# Patient Record
Sex: Female | Born: 1970 | Race: Black or African American | Hispanic: No | Marital: Single | Smoking: Former smoker
Health system: Southern US, Community
[De-identification: ages and names within clinical notes are randomized; demographics above are authoritative.]

## PROBLEM LIST (undated history)

## (undated) DIAGNOSIS — F419 Anxiety disorder, unspecified: Secondary | ICD-10-CM

## (undated) DIAGNOSIS — K219 Gastro-esophageal reflux disease without esophagitis: Secondary | ICD-10-CM

## (undated) DIAGNOSIS — I10 Essential (primary) hypertension: Secondary | ICD-10-CM

## (undated) HISTORY — DX: Gastro-esophageal reflux disease without esophagitis: K21.9

## (undated) HISTORY — DX: Anxiety disorder, unspecified: F41.9

## (undated) HISTORY — DX: Essential (primary) hypertension: I10

---

## 2018-09-30 ENCOUNTER — Emergency Department (HOSPITAL_COMMUNITY)
Admission: EM | Admit: 2018-09-30 | Discharge: 2018-09-30 | Disposition: A | Discharge status: Home / Self Care | Payer: Self-pay | Attending: Emergency Medicine | Admitting: Emergency Medicine

## 2018-09-30 ENCOUNTER — Encounter (HOSPITAL_COMMUNITY): Payer: Self-pay | Admitting: *Deleted

## 2018-09-30 ENCOUNTER — Other Ambulatory Visit: Payer: Self-pay

## 2018-09-30 ENCOUNTER — Emergency Department (HOSPITAL_COMMUNITY): Payer: Self-pay

## 2018-09-30 DIAGNOSIS — R110 Nausea: Secondary | ICD-10-CM | POA: Insufficient documentation

## 2018-09-30 DIAGNOSIS — R519 Headache, unspecified: Secondary | ICD-10-CM

## 2018-09-30 DIAGNOSIS — F17210 Nicotine dependence, cigarettes, uncomplicated: Secondary | ICD-10-CM | POA: Insufficient documentation

## 2018-09-30 DIAGNOSIS — R51 Headache: Secondary | ICD-10-CM | POA: Insufficient documentation

## 2018-09-30 DIAGNOSIS — I10 Essential (primary) hypertension: Secondary | ICD-10-CM | POA: Insufficient documentation

## 2018-09-30 DIAGNOSIS — R1013 Epigastric pain: Secondary | ICD-10-CM | POA: Insufficient documentation

## 2018-09-30 LAB — CBC
HCT: 42.5 % (ref 36.0–46.0)
Hemoglobin: 14.1 g/dL (ref 12.0–15.0)
MCH: 28.7 pg (ref 26.0–34.0)
MCHC: 33.2 g/dL (ref 30.0–36.0)
MCV: 86.6 fL (ref 80.0–100.0)
Platelets: 313 10*3/uL (ref 150–400)
RBC: 4.91 MIL/uL (ref 3.87–5.11)
RDW: 13.5 % (ref 11.5–15.5)
WBC: 6.3 10*3/uL (ref 4.0–10.5)
nRBC: 0 % (ref 0.0–0.2)

## 2018-09-30 LAB — COMPREHENSIVE METABOLIC PANEL
ALT: 17 U/L (ref 0–44)
AST: 19 U/L (ref 15–41)
Albumin: 4.3 g/dL (ref 3.5–5.0)
Alkaline Phosphatase: 64 U/L (ref 38–126)
Anion gap: 12 (ref 5–15)
BUN: 10 mg/dL (ref 6–20)
CO2: 23 mmol/L (ref 22–32)
Calcium: 9.5 mg/dL (ref 8.9–10.3)
Chloride: 103 mmol/L (ref 98–111)
Creatinine, Ser: 0.86 mg/dL (ref 0.44–1.00)
GFR calc Af Amer: >60 mL/min (ref >60)
GFR calc non Af Amer: >60 mL/min (ref >60)
Glucose, Bld: 94 mg/dL (ref 70–99)
Potassium: 4 mmol/L (ref 3.5–5.1)
Sodium: 138 mmol/L (ref 135–145)
Total Bilirubin: 0.4 mg/dL (ref 0.3–1.2)
Total Protein: 8 g/dL (ref 6.5–8.1)

## 2018-09-30 LAB — URINALYSIS, ROUTINE W REFLEX MICROSCOPIC
Bilirubin Urine: NEGATIVE
Glucose, UA: NEGATIVE mg/dL
Ketones, ur: 20 mg/dL — AB
Nitrite: NEGATIVE
Protein, ur: 30 mg/dL — AB
Specific Gravity, Urine: 1.026 (ref 1.005–1.030)
pH: 5 (ref 5.0–8.0)

## 2018-09-30 LAB — I-STAT BETA HCG BLOOD, ED (MC, WL, AP ONLY): I-stat hCG, quantitative: <5 m[IU]/mL (ref <5)

## 2018-09-30 LAB — LIPASE, BLOOD: Lipase: 21 U/L (ref 11–51)

## 2018-09-30 MED ORDER — PROCHLORPERAZINE EDISYLATE 10 MG/2ML IJ SOLN
10.0000 mg | Freq: Once | INTRAMUSCULAR | Status: AC
  Administered 2018-09-30: 20:00:00 10 mg via INTRAVENOUS
  Filled 2018-09-30: qty 2

## 2018-09-30 MED ORDER — DIPHENHYDRAMINE HCL 50 MG/ML IJ SOLN
12.5000 mg | Freq: Once | INTRAMUSCULAR | Status: AC
  Administered 2018-09-30: 20:00:00 12.5 mg via INTRAVENOUS
  Filled 2018-09-30: qty 1

## 2018-09-30 MED ORDER — ONDANSETRON 4 MG PO TBDP: 4.0000 mg | ORAL_TABLET | Freq: Three times a day (TID) | ORAL | 0 refills | Status: DC | PRN

## 2018-09-30 MED ORDER — NAPROXEN 500 MG PO TABS: 500.0000 mg | ORAL_TABLET | Freq: Two times a day (BID) | ORAL | 0 refills | Status: DC

## 2018-09-30 MED ORDER — FAMOTIDINE IN NACL 20-0.9 MG/50ML-% IV SOLN
20.0000 mg | Freq: Once | INTRAVENOUS | Status: AC
  Administered 2018-09-30: 20:00:00 20 mg via INTRAVENOUS
  Filled 2018-09-30: qty 50

## 2018-09-30 MED ORDER — SODIUM CHLORIDE 0.9 % IV BOLUS
1000.0000 mL | Freq: Once | INTRAVENOUS | Status: AC
  Administered 2018-09-30: 20:00:00 1000 mL via INTRAVENOUS

## 2018-09-30 MED ORDER — SODIUM CHLORIDE 0.9% FLUSH: 3.0000 mL | Freq: Once | INTRAVENOUS | Status: DC

## 2018-09-30 NOTE — ED Notes (Signed)
Patient transported to CT 

## 2018-09-30 NOTE — ED Triage Notes (Signed)
Pt in c/o headache x5 days, pt reports dizziness and nausea, pt feels off balance and fatigued, pt reports mid upper abd pain,denies fever, A&O x4

## 2018-09-30 NOTE — ED Notes (Signed)
Pt sitting in triage watching TV on her phone.

## 2018-09-30 NOTE — ED Notes (Signed)
Sent a urine culture with the specimen 

## 2018-09-30 NOTE — ED Provider Notes (Signed)
[facility] EMERGENCY DEPARTMENT Provider Note   CSN: [id number] Arrival date & time: 09/30/18  1346     History   Chief Complaint Chief Complaint  Patient presents with  . Headache    HPI Joyce Peters is a 48 y.o. female with a hx of tobacco abuse, HTN, hyperlipidemia, & pre-diabetes who presents to the ED w/ complaints of headache x 4 days. Patient states headache is located to the temporal region bilaterally, it is aching/throbbing, began gradually & has steadily progressed. Pain is currently a 9/10 in severity, was improved w/ tylenol but this is no longer helping, aggravated w/ eating. States she did have some mild blurry vision but this quickly resolved. She has associated nausea, mild aching/gassy epigastric discomfort, and feels a bit off balance/lightheaded, but no dizziness w/ the room spinning. Also notes intermittent tingling to the fingers/toes to all distal extremities however this has been going on awhile and is not new. She states she has had headaches somewhat similar but never that have lasted this long or been this severe. Denies fever, double vision, numbness, weakness, or syncope. Denies vomiting, chest pain, cough, melena, hematochezia, dysuria, or vaginal bleeding/discharge.      HPI  History reviewed. No pertinent past medical history.  There are no active problems to display for this patient.   History reviewed. No pertinent surgical history.   OB History   No obstetric history on file.      Home Medications    Prior to Admission medications   Not on File    Family History No family history on file.  Social History Social History   Tobacco Use  . Smoking status: Current Every Day Smoker    Packs/day: 0.25    Types: Cigarettes  . Smokeless tobacco: Never Used  Substance Use Topics  . Alcohol use: Not Currently  . Drug use: Not Currently     Allergies   Contrast media [iodinated diagnostic agents]   Review of  Systems Review of Systems  Constitutional: Negative for chills and fever.  Respiratory: Negative for shortness of breath.   Cardiovascular: Negative for chest pain.  Gastrointestinal: Positive for abdominal pain and nausea. Negative for anal bleeding, blood in stool, constipation and vomiting.  Genitourinary: Negative for dysuria, vaginal bleeding and vaginal discharge.  Neurological: Positive for light-headedness and headaches. Negative for dizziness, tremors, seizures, syncope, facial asymmetry, speech difficulty, weakness and numbness.       + for intermittent paresthesias to digits  All other systems reviewed and are negative.    Physical Exam Updated Vital Signs BP (!) 158/83 (BP Location: Left Arm)   Pulse 64   Temp 98.7 F (37.1 C) (Oral)   Resp 16   LMP  (Approximate)   SpO2 95%   Physical Exam Vitals signs and nursing note reviewed.  Constitutional:      General: She is not in acute distress.    Appearance: She is well-developed. She is not toxic-appearing.  HENT:     Head: Normocephalic and atraumatic.     Mouth/Throat:     Pharynx: Oropharynx is clear.  Eyes:     General: Vision grossly intact.        Right eye: No discharge.        Left eye: No discharge.     Extraocular Movements: Extraocular movements intact.     Conjunctiva/sclera: Conjunctivae normal.     Pupils: Pupils are equal, round, and reactive to light.     Comments: No proptosis.  Neck:     Musculoskeletal: Neck supple.     Comments: No nuchal rigidity.  Cardiovascular:     Rate and Rhythm: Normal rate and regular rhythm.  Pulmonary:     Effort: Pulmonary effort is normal. No respiratory distress.     Breath sounds: Normal breath sounds. No wheezing, rhonchi or rales.  Abdominal:     General: There is no distension.     Palpations: Abdomen is soft.     Tenderness: There is no abdominal tenderness. There is no guarding or rebound.  Skin:    General: Skin is warm and dry.     Findings: No  rash.  Neurological:     Mental Status: She is alert.     Comments: Alert. Clear speech. No facial droop. CNIII-XII grossly intact. Bilateral upper and lower extremities' sensation grossly intact. 5/5 symmetric strength with grip strength and with plantar and dorsi flexion bilaterally. Normal finger to nose bilaterally. Negative pronator drift. Negative Romberg sign. Gait is steady and intact.   Psychiatric:        Behavior: Behavior normal.    ED Treatments / Results  Labs (all labs ordered are listed, but only abnormal results are displayed) Labs Reviewed  URINALYSIS, ROUTINE W REFLEX MICROSCOPIC - Abnormal; Notable for the following components:      Result Value   APPearance CLOUDY (*)    Hgb urine dipstick SMALL (*)    Ketones, ur 20 (*)    Protein, ur 30 (*)    Leukocytes,Ua LARGE (*)    Bacteria, UA FEW (*)    All other components within normal limits  URINE CULTURE  LIPASE, BLOOD  COMPREHENSIVE METABOLIC PANEL  CBC  I-STAT BETA HCG BLOOD, ED ([facility], [facility], [facility] ONLY)    EKG None  Radiology Ct Head Wo Contrast  Result Date: 09/30/2018 CLINICAL DATA:  Headache EXAM: CT HEAD WITHOUT CONTRAST TECHNIQUE: Contiguous axial images were obtained from the base of the skull through the vertex without intravenous contrast. COMPARISON:  None. FINDINGS: Brain: No evidence of acute infarction, hemorrhage, hydrocephalus, extra-axial collection or mass lesion/mass effect. Vascular: No hyperdense vessel or unexpected calcification. Skull: Normal. Negative for fracture or focal lesion. Sinuses/Orbits: Mucosal thickening and retention cysts in the ethmoid and maxillary sinuses Other: None IMPRESSION: Negative non contrasted CT appearance of the brain Electronically Signed   By: Jasmine PangKim  Fujinaga M.D.   On: 09/30/2018 21:21    Procedures Procedures (including critical care time)  Medications Ordered in ED Medications  sodium chloride flush (NS) 0.9 % injection 3 mL (3 mLs Intravenous Not Given  09/30/18 1914)     Initial Impression / Assessment and Plan / ED Course  I have reviewed the triage vital signs and the nursing notes.  Pertinent labs & imaging results that were available during my care of the patient were reviewed by me and considered in my medical decision making (see chart for details).   Patient presents to the emergency department with complaints of headache, ultimately some nausea and abdominal pain.  She is nontoxic-appearing, no apparent distress, vitals WNL with exception of her elevated blood pressure, doubt HTN emergency.  She has an overall reassuring physical exam. Labs per triage reviewed: CBC: No leukocytosis or anemia CMP: No electrolyte derangement.  Renal function and LFTs WNL Lipase: WNL Urinalysis: Some changes of infection, however patient is without urinary symptoms Pregnancy test: Negative  Given patient has not had particularly similar headaches given this is more severe and has lasted longer with some  reported balance issues we will proceed with CT head.  Treatment with migraine cocktail.  CT head without acute findings, no mass, bleed, or obvious signs of stroke.  Patient is feeling much better following migraine cocktail, do not suspect SAH, ICH, ischemic CVA, dural venous sinus thrombosis, acute glaucoma, giant cell arteritis, mass, or meningitis.  Regarding her abdominal discomfort, abdomen is nontender without peritoneal signs, reassuring labs, do not suspect acute surgical abdomen.  Will discharge home with symptomatic care including Zofran and naproxen.  Primary care and neurology follow-up provided. I discussed results, treatment plan, need for follow-up, and return precautions with the patient. Provided opportunity for questions, patient confirmed understanding and is in agreement with plan.   Findings and plan of care discussed with supervising physician Dr. Sedonia Small who has evaluated patient & is in agreement.    Final Clinical Impressions(s) /  ED Diagnoses   Final diagnoses:  Acute nonintractable headache, unspecified headache type    ED Discharge Orders         Ordered    naproxen (NAPROSYN) 500 MG tablet  2 times daily     09/30/18 2141    ondansetron (ZOFRAN ODT) 4 MG disintegrating tablet  Every 8 hours PRN     09/30/18 2141           Amaryllis Dyke, PA-C 09/30/18 2143    Maudie Flakes, MD 09/30/18 2349

## 2018-09-30 NOTE — ED Notes (Signed)
Pt in bed; c/o not feeling well, pt is crying and stated she doesn't feel right. Encouraged pt to focus on breathing; vital signs are WNL. Pt stated she is scared, encouraged pt to call family. Pt is directable. Will notify PA.

## 2018-09-30 NOTE — Discharge Instructions (Addendum)
You were seen in the emergency department today for a headache.  Your labs were reassuring.  Your CT scan did not show any acute abnormalities. Your symptoms improved following a migraine cocktail in the ER. We are sending you home with the following medicines: - Naproxen is a nonsteroidal anti-inflammatory medication that will help with pain and swelling. Be sure to take this medication as prescribed with food, 1 pill every 12 hours,  It should be taken with food, as it can cause stomach upset, and more seriously, stomach bleeding. Do not take other nonsteroidal anti-inflammatory medications with this such as Advil, Motrin, Aleve, Mobic, Goodie Powder, or Motrin.    - Zofran-this is a antinausea medication you may take every 8 hours as needed for nausea and vomiting. You make take Tylenol per over the counter dosing with these medications.   We have prescribed you new medication(s) today. Discuss the medications prescribed today with your pharmacist as they can have adverse effects and interactions with your other medicines including over the counter and prescribed medications. Seek medical evaluation if you start to experience new or abnormal symptoms after taking one of these medicines, seek care immediately if you start to experience difficulty breathing, feeling of your throat closing, facial swelling, or rash as these could be indications of a more serious allergic reaction   Please follow-up with your primary care provider and/or with neurology within 3 to 5 days.  Return to the ER for new or worsening symptoms including but not limited to worsening pain, change in your vision, arm or leg becoming weak or numb, dizziness like the room spinning, fever, passing out, or any other concerns.

## 2018-10-02 LAB — URINE CULTURE: Culture: >=100000 — AB

## 2018-10-03 ENCOUNTER — Telehealth: Payer: Self-pay | Admitting: Emergency Medicine

## 2018-10-03 NOTE — Telephone Encounter (Signed)
Post ED Visit - Positive Culture Follow-up  Culture report reviewed by antimicrobial stewardship pharmacist: [place] Team []  Elenor Quinones, Pharm.D. []  Heide Guile, Pharm.D., BCPS AQ-ID []  Parks Neptune, Pharm.D., BCPS [x]  Alycia Rossetti, Pharm.D., BCPS []  [place], Pharm.D., BCPS, AAHIVP []  Legrand Como, Pharm.D., BCPS, AAHIVP []  Salome Arnt, PharmD, BCPS []  Johnnette Gourd, PharmD, BCPS []  Hughes Better, PharmD, BCPS []  Leeroy Cha, PharmD []  Laqueta Linden, PharmD, BCPS []  Albertina Parr, PharmD  [place] Team []  Leodis Sias, PharmD []  Lindell Spar, PharmD []  Royetta Asal, PharmD []  Graylin Shiver, Rph []  Rema Fendt) Glennon Mac, PharmD []  Arlyn Dunning, PharmD []  Netta Cedars, PharmD []  Dia Sitter, PharmD []  Leone Haven, PharmD []  Gretta Arab, PharmD []  Theodis Shove, PharmD []  Peggyann Juba, PharmD []  Reuel Boom, PharmD   Positive urine culture Asymptomatic, no further patient follow-up is required at this time.  Sandi Raveling Wisam Siefring 10/03/2018, 11:41 AM

## 2018-12-27 ENCOUNTER — Encounter (HOSPITAL_COMMUNITY): Payer: Self-pay | Admitting: Emergency Medicine

## 2018-12-27 ENCOUNTER — Other Ambulatory Visit: Payer: Self-pay

## 2018-12-27 ENCOUNTER — Emergency Department (HOSPITAL_COMMUNITY)
Admission: EM | Admit: 2018-12-27 | Discharge: 2018-12-27 | Disposition: A | Discharge status: Home / Self Care | Payer: Self-pay | Attending: Emergency Medicine | Admitting: Emergency Medicine

## 2018-12-27 DIAGNOSIS — A599 Trichomoniasis, unspecified: Secondary | ICD-10-CM

## 2018-12-27 DIAGNOSIS — F17210 Nicotine dependence, cigarettes, uncomplicated: Secondary | ICD-10-CM | POA: Insufficient documentation

## 2018-12-27 DIAGNOSIS — Z79899 Other long term (current) drug therapy: Secondary | ICD-10-CM | POA: Insufficient documentation

## 2018-12-27 DIAGNOSIS — N760 Acute vaginitis: Secondary | ICD-10-CM | POA: Insufficient documentation

## 2018-12-27 DIAGNOSIS — B9689 Other specified bacterial agents as the cause of diseases classified elsewhere: Secondary | ICD-10-CM

## 2018-12-27 LAB — WET PREP, GENITAL
Sperm: NONE SEEN
Yeast Wet Prep HPF POC: NONE SEEN

## 2018-12-27 LAB — URINALYSIS, ROUTINE W REFLEX MICROSCOPIC
Bacteria, UA: NONE SEEN
Bilirubin Urine: NEGATIVE
Glucose, UA: NEGATIVE mg/dL
Ketones, ur: NEGATIVE mg/dL
Nitrite: NEGATIVE
Protein, ur: NEGATIVE mg/dL
Specific Gravity, Urine: 1.03 (ref 1.005–1.030)
pH: 5 (ref 5.0–8.0)

## 2018-12-27 LAB — PREGNANCY, URINE: Preg Test, Ur: NEGATIVE

## 2018-12-27 MED ORDER — AZITHROMYCIN 250 MG PO TABS
1000.0000 mg | ORAL_TABLET | Freq: Once | ORAL | Status: AC
  Administered 2018-12-27: 1000 mg via ORAL
  Filled 2018-12-27: qty 4

## 2018-12-27 MED ORDER — METRONIDAZOLE 500 MG PO TABS: 500.0000 mg | ORAL_TABLET | Freq: Two times a day (BID) | ORAL | 0 refills | Status: DC

## 2018-12-27 MED ORDER — LIDOCAINE HCL (PF) 1 % IJ SOLN
INTRAMUSCULAR | Status: AC
  Administered 2018-12-27: 15:00:00 5 mL
  Filled 2018-12-27: qty 5

## 2018-12-27 MED ORDER — STERILE WATER FOR INJECTION IJ SOLN
INTRAMUSCULAR | Status: AC
  Administered 2018-12-27: 15:00:00 10 mL
  Filled 2018-12-27: qty 10

## 2018-12-27 MED ORDER — CEFTRIAXONE SODIUM 250 MG IJ SOLR
250.0000 mg | Freq: Once | INTRAMUSCULAR | Status: AC
  Administered 2018-12-27: 14:00:00 250 mg via INTRAMUSCULAR
  Filled 2018-12-27: qty 250

## 2018-12-27 NOTE — ED Triage Notes (Signed)
Pt in with c/o vaginal discharge x 1 wk. States it is clear and malodorous. Denies any pain, bleeding or urinary symptoms.

## 2018-12-27 NOTE — ED Notes (Signed)
Discharge instructions and prescription discussed with Pt. Pt verbalized understanding. Pt stable and ambulatory.    

## 2018-12-27 NOTE — Discharge Instructions (Signed)
You have been treated today for an STD.   You need to continue taking Flagyl for treatment of trichomonas and BV. Take Flagyl as directed.  It is very important that you do not consume any alcohol while taking this medication as it will cause you to become violently ill.  The gonorrhea and chlamydia test results with take 2-3 days to return. If there is an abnormal result, you will be notified. If you do not hear anything, that means the results were negative. You can also log on MyChart to see the results.  If you are positive, you have been treated and did not need any further evaluation.  Your sexual partner needs to be treated too. Do not have sexual intercourse for the next 7 days and after your partner has been treated.   Follow-up with your primary care doctor in 2-4 days. If you do not have a primary care doctor, you can use one listed in the paperwork.   Return to the Emergency Department for any fever, abdominal pain, difficulty breathing, nausea/vomiting or any other worsening or concerning symptoms.

## 2018-12-27 NOTE — ED Provider Notes (Signed)
[facility] EMERGENCY DEPARTMENT Provider Note   CSN: [id number] Arrival date & time: 12/27/18  1227     History Chief Complaint  Patient presents with  . Vaginal Discharge    Joyce Peters is a 48 y.o. female who presents for evaluation of 1 week of vaginal discharge.  She has had foul-smelling, white discharge noted.  She states that she has not had any vaginal pain, vaginal bleeding, dysuria, hematuria.  She states she is not currently sexually active and that the last time she had intercourse was about a year ago.  She denies any fevers, abdominal pain, nausea/vomiting.  The history is provided by the patient.       History reviewed. No pertinent past medical history.  There are no problems to display for this patient.   History reviewed. No pertinent surgical history.   OB History   No obstetric history on file.     No family history on file.  Social History   Tobacco Use  . Smoking status: Current Every Day Smoker    Packs/day: 0.25    Types: Cigarettes  . Smokeless tobacco: Never Used  Substance Use Topics  . Alcohol use: Not Currently  . Drug use: Not Currently    Home Medications Prior to Admission medications   Medication Sig Start Date End Date Taking? Authorizing Provider  metroNIDAZOLE (FLAGYL) 500 MG tablet Take 1 tablet (500 mg total) by mouth 2 (two) times daily. 12/27/18   Volanda Napoleon, PA-C  naproxen (NAPROSYN) 500 MG tablet Take 1 tablet (500 mg total) by mouth 2 (two) times daily. 09/30/18   Petrucelli, Samantha R, PA-C  ondansetron (ZOFRAN ODT) 4 MG disintegrating tablet Take 1 tablet (4 mg total) by mouth every 8 (eight) hours as needed for nausea or vomiting. 09/30/18   Petrucelli, Samantha R, PA-C    Allergies    Contrast media [iodinated diagnostic agents]  Review of Systems   Review of Systems  Genitourinary: Positive for vaginal discharge. Negative for dysuria, hematuria, vaginal bleeding and vaginal pain.   All other systems reviewed and are negative.   Physical Exam Updated Vital Signs BP 128/76 (BP Location: Right Arm)   Pulse 97   Temp 98.6 F (37 C) (Oral)   Resp 20   Wt 81.6 kg   SpO2 99%   Physical Exam Vitals and nursing note reviewed. Exam conducted with a chaperone present.  Constitutional:      Appearance: She is well-developed.  HENT:     Head: Normocephalic and atraumatic.  Eyes:     General: No scleral icterus.       Right eye: No discharge.        Left eye: No discharge.     Conjunctiva/sclera: Conjunctivae normal.  Pulmonary:     Effort: Pulmonary effort is normal.  Abdominal:     Comments: Abdomen is soft, non-distended, non-tender. No rigidity, No guarding. No peritoneal signs.  Genitourinary:    Vagina: Vaginal discharge present.     Cervix: No cervical motion tenderness.     Adnexa:        Right: No mass or tenderness.         Left: No mass or tenderness.       Comments: The exam was performed with a chaperone present. Normal external female genitalia. No lesions, rash, or sores.  Cervix is slightly erythematous.  White discharge in vaginal vault.  No CMT.  No adnexal mass or tenderness noted bilaterally. Skin:  General: Skin is warm and dry.  Neurological:     Mental Status: She is alert.  Psychiatric:        Speech: Speech normal.        Behavior: Behavior normal.     ED Results / Procedures / Treatments   Labs (all labs ordered are listed, but only abnormal results are displayed) Labs Reviewed  WET PREP, GENITAL - Abnormal; Notable for the following components:      Result Value   Trich, Wet Prep PRESENT (*)    Clue Cells Wet Prep HPF POC PRESENT (*)    WBC, Wet Prep HPF POC MANY (*)    All other components within normal limits  URINALYSIS, ROUTINE W REFLEX MICROSCOPIC - Abnormal; Notable for the following components:   Hgb urine dipstick SMALL (*)    Leukocytes,Ua TRACE (*)    All other components within normal limits  PREGNANCY,  URINE  GC/CHLAMYDIA PROBE AMP ([place]) NOT AT [facility]    EKG None  Radiology No results found.  Procedures Procedures (including critical care time)  Medications Ordered in ED Medications  azithromycin (ZITHROMAX) tablet 1,000 mg (has no administration in time range)  cefTRIAXone (ROCEPHIN) injection 250 mg (has no administration in time range)    ED Course  I have reviewed the triage vital signs and the nursing notes.  Pertinent labs & imaging results that were available during my care of the patient were reviewed by me and considered in my medical decision making (see chart for details).    MDM Rules/Calculators/A&P       48 year old female who presents for evaluation of vaginal discharge x1 week.  Reports it is malodorous.  No vaginal bleeding, dysuria, hematuria, fevers, abdominal pain, nausea/vomiting.  Reports she last was sexually active about a year ago. Patient is afebrile, non-toxic appearing, sitting comfortably on examination table. Vital signs reviewed and stable.  Benign abdominal exam.  Plan for pelvic, urine.  Pelvic exam as documented above.  No CMT that would be concerning for PID.  No adnexal mass or tenderness noted bilaterally.  No indication for further ultrasound imaging.  Plan pregnancy negative.  UA shows trace leukocytes.  She is not having any symptoms.  Wet prep shows positive cells, clue cells.  We will plan to treat.  Discussed results with patient.  She is agreeable to plan.  She is requesting treatment for gonorrhea and chlamydia while here in the ED.  Patient with no known drug allergies. At this time, patient exhibits no emergent life-threatening condition that require further evaluation in ED. Patient had ample opportunity for questions and discussion. All patient's questions were answered with full understanding. Strict return precautions discussed. Patient expresses understanding and agreement to plan.   Portions of this note were generated  with Scientist, clinical (histocompatibility and immunogenetics). Dictation errors may occur despite best attempts at proofreading.  Final Clinical Impression(s) / ED Diagnoses Final diagnoses:  BV (bacterial vaginosis)  Trichimoniasis    Rx / DC Orders ED Discharge Orders         Ordered    metroNIDAZOLE (FLAGYL) 500 MG tablet  2 times daily     12/27/18 1401           Rosana Hoes 12/27/18 1422    Jacalyn Lefevre, MD 12/27/18 1448

## 2018-12-30 LAB — GC/CHLAMYDIA PROBE AMP (~~LOC~~) NOT AT ARMC
Chlamydia: NEGATIVE
Neisseria Gonorrhea: NEGATIVE

## 2019-10-19 ENCOUNTER — Other Ambulatory Visit: Payer: Self-pay

## 2019-10-19 ENCOUNTER — Emergency Department (HOSPITAL_COMMUNITY): Payer: Self-pay

## 2019-10-19 ENCOUNTER — Encounter (HOSPITAL_COMMUNITY): Payer: Self-pay

## 2019-10-19 ENCOUNTER — Observation Stay (HOSPITAL_COMMUNITY)
Admission: EM | Admit: 2019-10-19 | Discharge: 2019-10-21 | Disposition: A | Discharge status: Home / Self Care | Payer: Self-pay | Attending: Internal Medicine | Admitting: Internal Medicine

## 2019-10-19 DIAGNOSIS — Z20822 Contact with and (suspected) exposure to covid-19: Secondary | ICD-10-CM | POA: Insufficient documentation

## 2019-10-19 DIAGNOSIS — F17210 Nicotine dependence, cigarettes, uncomplicated: Secondary | ICD-10-CM | POA: Insufficient documentation

## 2019-10-19 DIAGNOSIS — Z79899 Other long term (current) drug therapy: Secondary | ICD-10-CM | POA: Insufficient documentation

## 2019-10-19 DIAGNOSIS — R079 Chest pain, unspecified: Secondary | ICD-10-CM | POA: Diagnosis present

## 2019-10-19 DIAGNOSIS — I471 Supraventricular tachycardia, unspecified: Secondary | ICD-10-CM | POA: Diagnosis present

## 2019-10-19 DIAGNOSIS — I2489 Other forms of acute ischemic heart disease: Secondary | ICD-10-CM | POA: Diagnosis present

## 2019-10-19 DIAGNOSIS — R778 Other specified abnormalities of plasma proteins: Secondary | ICD-10-CM | POA: Insufficient documentation

## 2019-10-19 LAB — CBC
HCT: 36.4 % (ref 36.0–46.0)
Hemoglobin: 11.5 g/dL — ABNORMAL LOW (ref 12.0–15.0)
MCH: 27.4 pg (ref 26.0–34.0)
MCHC: 31.6 g/dL (ref 30.0–36.0)
MCV: 86.7 fL (ref 80.0–100.0)
Platelets: 298 10*3/uL (ref 150–400)
RBC: 4.2 MIL/uL (ref 3.87–5.11)
RDW: 13.5 % (ref 11.5–15.5)
WBC: 6.6 10*3/uL (ref 4.0–10.5)
nRBC: 0 % (ref 0.0–0.2)

## 2019-10-19 LAB — COMPREHENSIVE METABOLIC PANEL
ALT: 19 U/L (ref 0–44)
AST: 19 U/L (ref 15–41)
Albumin: 3.4 g/dL — ABNORMAL LOW (ref 3.5–5.0)
Alkaline Phosphatase: 50 U/L (ref 38–126)
Anion gap: 10 (ref 5–15)
BUN: 8 mg/dL (ref 6–20)
CO2: 23 mmol/L (ref 22–32)
Calcium: 8.1 mg/dL — ABNORMAL LOW (ref 8.9–10.3)
Chloride: 109 mmol/L (ref 98–111)
Creatinine, Ser: 0.84 mg/dL (ref 0.44–1.00)
GFR calc Af Amer: >60 mL/min (ref >60)
GFR calc non Af Amer: >60 mL/min (ref >60)
Glucose, Bld: 99 mg/dL (ref 70–99)
Potassium: 2.8 mmol/L — ABNORMAL LOW (ref 3.5–5.1)
Sodium: 142 mmol/L (ref 135–145)
Total Bilirubin: 0.5 mg/dL (ref 0.3–1.2)
Total Protein: 5.9 g/dL — ABNORMAL LOW (ref 6.5–8.1)

## 2019-10-19 LAB — I-STAT BETA HCG BLOOD, ED (MC, WL, AP ONLY): I-stat hCG, quantitative: <5 m[IU]/mL (ref <5)

## 2019-10-19 LAB — MAGNESIUM: Magnesium: 1.5 mg/dL — ABNORMAL LOW (ref 1.7–2.4)

## 2019-10-19 LAB — TROPONIN I (HIGH SENSITIVITY): Troponin I (High Sensitivity): 71 ng/L — ABNORMAL HIGH (ref <18)

## 2019-10-19 MED ORDER — ONDANSETRON HCL 4 MG/2ML IJ SOLN
4.0000 mg | Freq: Once | INTRAMUSCULAR | Status: AC
  Administered 2019-10-19: 4 mg via INTRAVENOUS
  Filled 2019-10-19: qty 2

## 2019-10-19 MED ORDER — POTASSIUM CHLORIDE 10 MEQ/100ML IV SOLN
10.0000 meq | INTRAVENOUS | Status: AC
  Administered 2019-10-19 (×2): 10 meq via INTRAVENOUS
  Filled 2019-10-19 (×2): qty 100

## 2019-10-19 MED ORDER — POTASSIUM CHLORIDE CRYS ER 20 MEQ PO TBCR
40.0000 meq | EXTENDED_RELEASE_TABLET | Freq: Once | ORAL | Status: AC
  Administered 2019-10-19: 40 meq via ORAL
  Filled 2019-10-19: qty 2

## 2019-10-19 MED ORDER — MAGNESIUM OXIDE 400 (241.3 MG) MG PO TABS
800.0000 mg | ORAL_TABLET | Freq: Once | ORAL | Status: AC
  Administered 2019-10-19: 800 mg via ORAL
  Filled 2019-10-19: qty 2

## 2019-10-19 NOTE — ED Provider Notes (Signed)
[facility]-EMERGENCY DEPT [facility] Emergency Department Provider Note MRN:  [id number]  Arrival date & time: 10/19/19     Chief Complaint   Palpitations   History of Present Illness   Joyce Peters is a 49 y.o. year-old female with no pertinent past medical history presenting to the ED with chief complaint of ideation.  Sudden onset palpitations, shortness of breath, chest tightness.  This occurred while patient was walking out of the police department.  She had a stressful day and the police knocked on her door and she had to go and provide bail for a family member.  She was found to be in SVT by EMS and provided with adenosine which improved her heart rate and symptoms.  She continues to feel weak, "wore out", still endorsing some mild chest pain as well as a dull frontal headache.  Denies any vomiting, no abdominal pain, no recent leg pain or swelling, no birth control pills, no recent travel.  Review of Systems  A complete 10 system review of systems was obtained and all systems are negative except as noted in the HPI and PMH.   Patient's Health History   History reviewed. No pertinent past medical history.  History reviewed. No pertinent surgical history.  No family history on file.  Social History   Socioeconomic History   Marital status: Single    Spouse name: Not on file   Number of children: Not on file   Years of education: Not on file   Highest education level: Not on file  Occupational History   Not on file  Tobacco Use   Smoking status: Current Every Day Smoker    Packs/day: 0.25    Types: Cigarettes   Smokeless tobacco: Never Used  Vaping Use   Vaping Use: Never used  Substance and Sexual Activity   Alcohol use: Not Currently   Drug use: Not Currently   Sexual activity: Not on file  Other Topics Concern   Not on file  Social History Narrative   Not on file   Social Determinants of Health   Financial Resource Strain:    Difficulty of  Paying Living Expenses: Not on file  Food Insecurity:    Worried About Running Out of Food in the Last Year: Not on file   Ran Out of Food in the Last Year: Not on file  Transportation Needs:    Lack of Transportation (Medical): Not on file   Lack of Transportation (Non-Medical): Not on file  Physical Activity:    Days of Exercise per Week: Not on file   Minutes of Exercise per Session: Not on file  Stress:    Feeling of Stress : Not on file  Social Connections:    Frequency of Communication with Friends and Family: Not on file   Frequency of Social Gatherings with Friends and Family: Not on file   Attends Religious Services: Not on file   Active Member of Clubs or Organizations: Not on file   Attends Banker Meetings: Not on file   Marital Status: Not on file  Intimate Partner Violence:    Fear of Current or Ex-Partner: Not on file   Emotionally Abused: Not on file   Physically Abused: Not on file   Sexually Abused: Not on file     Physical Exam   Vitals:   10/19/19 2230 10/19/19 2300  BP: (!) 148/87 (!) 156/98  Pulse: 81 89  Resp: 19 (!) 23  Temp:    SpO2: 99%  97%    CONSTITUTIONAL: Well-appearing, NAD NEURO:  Alert and oriented x 3, no focal deficits EYES:  eyes equal and reactive ENT/NECK:  no LAD, no JVD CARDIO: Regular rate, well-perfused, normal S1 and S2 PULM:  CTAB no wheezing or rhonchi GI/GU:  normal bowel sounds, non-distended, non-tender MSK/SPINE:  No gross deformities, no edema SKIN:  no rash, atraumatic PSYCH:  Appropriate speech and behavior  *Additional and/or pertinent findings included in MDM below  Diagnostic and Interventional Summary    EKG Interpretation  Date/Time:  Monday October 19 2019 20:23:21 EDT Ventricular Rate:  88 PR Interval:    QRS Duration: 85 QT Interval:  370 QTC Calculation: 448 R Axis:   44 Text Interpretation: Sinus rhythm Left atrial enlargement Minimal ST depression, inferior leads No  previous ECGs available Confirmed by Kennis Carina [phone]) on 10/19/2019 9:13:32 PM      Labs Reviewed  CBC - Abnormal; Notable for the following components:      Result Value   Hemoglobin 11.5 (*)    All other components within normal limits  COMPREHENSIVE METABOLIC PANEL - Abnormal; Notable for the following components:   Potassium 2.8 (*)    Calcium 8.1 (*)    Total Protein 5.9 (*)    Albumin 3.4 (*)    All other components within normal limits  MAGNESIUM - Abnormal; Notable for the following components:   Magnesium 1.5 (*)    All other components within normal limits  TROPONIN I (HIGH SENSITIVITY) - Abnormal; Notable for the following components:   Troponin I (High Sensitivity) 71 (*)    All other components within normal limits  D-DIMER, QUANTITATIVE (NOT AT [facility])  I-STAT BETA HCG BLOOD, ED ([facility], [facility], [facility] ONLY)  TROPONIN I (HIGH SENSITIVITY)    DG Chest Port 1 View  Final Result      Medications  potassium chloride 10 mEq in 100 mL IVPB (has no administration in time range)  magnesium oxide (MAG-OX) tablet 800 mg (has no administration in time range)  potassium chloride SA (KLOR-CON) CR tablet 40 mEq (has no administration in time range)     Procedures  /  Critical Care Procedures  ED Course and Medical Decision Making  I have reviewed the triage vital signs, the nursing notes, and pertinent available records from the EMR.  Listed above are laboratory and imaging tests that I personally ordered, reviewed, and interpreted and then considered in my medical decision making (see below for details).  SVT seen by EMS, responded to adenosine.  Patient is currently in sinus rhythm, no acute distress, continues to have some mild chest tightness.  No significant cardiovascular risk factors, no significant PE risk factors.  Given the continued chest discomfort will evaluate with 2 troponins.  EKG demonstrating some nonspecific findings with no prior for comparison.     First  troponin is 77, unclear if related to rate, ischemia, and this does increase the concern for PE.  Adding on D-dimer.  Plan for admission hospitalist vs cards pending second trop.  Signed out to oncoming provider at shift change.  Elmer Sow. Pilar Plate, MD [facility] Emergency Medicine [facility] [email or web address]  Final Clinical Impressions(s) / ED Diagnoses     ICD-10-CM   1. SVT (supraventricular tachycardia) (HCC)  I47.1   2. Chest pain, unspecified type  R07.9   3. Elevated troponin  R77.8     ED Discharge Orders    None       Discharge Instructions Discussed with  and Provided to Patient:   Discharge Instructions   None       Sabas Sous, MD 10/19/19 2316

## 2019-10-19 NOTE — ED Triage Notes (Signed)
Pt from home; reports palpitations and SOB after receiving stressful news; EMS found pt to be in SVT at 190; IV established and 6 mg Adenosine given with return to NSR of 85 after administration.  Pt currently denies complaints.

## 2019-10-20 ENCOUNTER — Encounter (HOSPITAL_COMMUNITY): Payer: Self-pay | Admitting: Internal Medicine

## 2019-10-20 ENCOUNTER — Observation Stay (HOSPITAL_BASED_OUTPATIENT_CLINIC_OR_DEPARTMENT_OTHER): Payer: Self-pay

## 2019-10-20 DIAGNOSIS — I2489 Other forms of acute ischemic heart disease: Secondary | ICD-10-CM | POA: Diagnosis present

## 2019-10-20 DIAGNOSIS — R9431 Abnormal electrocardiogram [ECG] [EKG]: Secondary | ICD-10-CM

## 2019-10-20 DIAGNOSIS — I471 Supraventricular tachycardia, unspecified: Secondary | ICD-10-CM

## 2019-10-20 DIAGNOSIS — R079 Chest pain, unspecified: Secondary | ICD-10-CM

## 2019-10-20 DIAGNOSIS — R778 Other specified abnormalities of plasma proteins: Secondary | ICD-10-CM

## 2019-10-20 DIAGNOSIS — R7989 Other specified abnormal findings of blood chemistry: Secondary | ICD-10-CM

## 2019-10-20 DIAGNOSIS — E7849 Other hyperlipidemia: Secondary | ICD-10-CM

## 2019-10-20 HISTORY — DX: Supraventricular tachycardia: I47.1

## 2019-10-20 HISTORY — DX: Supraventricular tachycardia, unspecified: I47.10

## 2019-10-20 LAB — ECHOCARDIOGRAM COMPLETE
Area-P 1/2: 2.91 cm2
Calc EF: 64.5 %
S' Lateral: 2.9 cm
Single Plane A2C EF: 63.5 %
Single Plane A4C EF: 67.1 %

## 2019-10-20 LAB — RAPID URINE DRUG SCREEN, HOSP PERFORMED
Amphetamines: NOT DETECTED
Barbiturates: NOT DETECTED
Benzodiazepines: NOT DETECTED
Cocaine: NOT DETECTED
Opiates: NOT DETECTED
Tetrahydrocannabinol: POSITIVE — AB

## 2019-10-20 LAB — RESPIRATORY PANEL BY RT PCR (FLU A&B, COVID)
Influenza A by PCR: NEGATIVE
Influenza B by PCR: NEGATIVE
SARS Coronavirus 2 by RT PCR: NEGATIVE

## 2019-10-20 LAB — HIV ANTIBODY (ROUTINE TESTING W REFLEX): HIV Screen 4th Generation wRfx: NONREACTIVE

## 2019-10-20 LAB — CBC
HCT: 38.1 % (ref 36.0–46.0)
Hemoglobin: 12.2 g/dL (ref 12.0–15.0)
MCH: 28 pg (ref 26.0–34.0)
MCHC: 32 g/dL (ref 30.0–36.0)
MCV: 87.4 fL (ref 80.0–100.0)
Platelets: 315 10*3/uL (ref 150–400)
RBC: 4.36 MIL/uL (ref 3.87–5.11)
RDW: 13.7 % (ref 11.5–15.5)
WBC: 6.7 10*3/uL (ref 4.0–10.5)
nRBC: 0 % (ref 0.0–0.2)

## 2019-10-20 LAB — BASIC METABOLIC PANEL
Anion gap: 11 (ref 5–15)
BUN: 8 mg/dL (ref 6–20)
CO2: 23 mmol/L (ref 22–32)
Calcium: 9.1 mg/dL (ref 8.9–10.3)
Chloride: 106 mmol/L (ref 98–111)
Creatinine, Ser: 0.7 mg/dL (ref 0.44–1.00)
GFR calc Af Amer: >60 mL/min (ref >60)
GFR calc non Af Amer: >60 mL/min (ref >60)
Glucose, Bld: 97 mg/dL (ref 70–99)
Potassium: 4.1 mmol/L (ref 3.5–5.1)
Sodium: 140 mmol/L (ref 135–145)

## 2019-10-20 LAB — LIPID PANEL
Cholesterol: 237 mg/dL — ABNORMAL HIGH (ref 0–200)
HDL: 44 mg/dL (ref >40)
LDL Cholesterol: 178 mg/dL — ABNORMAL HIGH (ref 0–99)
Total CHOL/HDL Ratio: 5.4 RATIO
Triglycerides: 73 mg/dL (ref <150)
VLDL: 15 mg/dL (ref 0–40)

## 2019-10-20 LAB — D-DIMER, QUANTITATIVE: D-Dimer, Quant: 0.45 ug/mL-FEU (ref 0.00–0.50)

## 2019-10-20 LAB — PREGNANCY, URINE: Preg Test, Ur: NEGATIVE

## 2019-10-20 LAB — TROPONIN I (HIGH SENSITIVITY)
Troponin I (High Sensitivity): 312 ng/L (ref <18)
Troponin I (High Sensitivity): 170 ng/L (ref <18)

## 2019-10-20 LAB — MAGNESIUM: Magnesium: 1.9 mg/dL (ref 1.7–2.4)

## 2019-10-20 LAB — T4, FREE: Free T4: 0.86 ng/dL (ref 0.61–1.12)

## 2019-10-20 LAB — HEPARIN LEVEL (UNFRACTIONATED): Heparin Unfractionated: 0.23 IU/mL — ABNORMAL LOW (ref 0.30–0.70)

## 2019-10-20 LAB — TSH: TSH: 0.884 u[IU]/mL (ref 0.350–4.500)

## 2019-10-20 MED ORDER — ACETAMINOPHEN 325 MG PO TABS
650.0000 mg | ORAL_TABLET | Freq: Four times a day (QID) | ORAL | Status: DC | PRN
  Administered 2019-10-21 (×2): 650 mg via ORAL
  Filled 2019-10-20 (×2): qty 2

## 2019-10-20 MED ORDER — ASPIRIN EC 81 MG PO TBEC
81.0000 mg | DELAYED_RELEASE_TABLET | Freq: Every day | ORAL | Status: DC
  Administered 2019-10-21: 81 mg via ORAL
  Filled 2019-10-20: qty 1

## 2019-10-20 MED ORDER — DIPHENHYDRAMINE HCL 25 MG PO CAPS: 50.0000 mg | ORAL_CAPSULE | Freq: Once | ORAL | Status: DC

## 2019-10-20 MED ORDER — ONDANSETRON HCL 4 MG PO TABS: 4.0000 mg | ORAL_TABLET | Freq: Four times a day (QID) | ORAL | Status: DC | PRN

## 2019-10-20 MED ORDER — HEPARIN BOLUS VIA INFUSION
4000.0000 [IU] | Freq: Once | INTRAVENOUS | Status: AC
  Administered 2019-10-20: 4000 [IU] via INTRAVENOUS
  Filled 2019-10-20: qty 4000

## 2019-10-20 MED ORDER — ENOXAPARIN SODIUM 40 MG/0.4ML ~~LOC~~ SOLN: 40.0000 mg | SUBCUTANEOUS | Status: DC

## 2019-10-20 MED ORDER — PREDNISONE 20 MG PO TABS
50.0000 mg | ORAL_TABLET | Freq: Four times a day (QID) | ORAL | Status: AC
  Administered 2019-10-20 – 2019-10-21 (×3): 50 mg via ORAL
  Filled 2019-10-20 (×4): qty 2

## 2019-10-20 MED ORDER — METHYLPREDNISOLONE SODIUM SUCC 40 MG IJ SOLR: 40.0000 mg | INTRAMUSCULAR | Status: DC

## 2019-10-20 MED ORDER — ASPIRIN 81 MG PO CHEW
324.0000 mg | CHEWABLE_TABLET | Freq: Once | ORAL | Status: AC
  Administered 2019-10-20: 324 mg via ORAL
  Filled 2019-10-20: qty 4

## 2019-10-20 MED ORDER — NITROGLYCERIN 0.4 MG SL SUBL: 0.4000 mg | SUBLINGUAL_TABLET | SUBLINGUAL | Status: DC | PRN

## 2019-10-20 MED ORDER — DIPHENHYDRAMINE HCL 50 MG/ML IJ SOLN: 50.0000 mg | Freq: Once | INTRAMUSCULAR | Status: DC

## 2019-10-20 MED ORDER — ONDANSETRON HCL 4 MG/2ML IJ SOLN: 4.0000 mg | Freq: Four times a day (QID) | INTRAMUSCULAR | Status: DC | PRN

## 2019-10-20 MED ORDER — DIPHENHYDRAMINE HCL 25 MG PO CAPS
50.0000 mg | ORAL_CAPSULE | Freq: Once | ORAL | Status: AC
  Administered 2019-10-21: 50 mg via ORAL
  Filled 2019-10-20: qty 2

## 2019-10-20 MED ORDER — PREDNISONE 20 MG PO TABS
50.0000 mg | ORAL_TABLET | Freq: Four times a day (QID) | ORAL | Status: DC
  Administered 2019-10-20: 50 mg via ORAL
  Filled 2019-10-20: qty 3

## 2019-10-20 MED ORDER — ACETAMINOPHEN 650 MG RE SUPP: 650.0000 mg | Freq: Four times a day (QID) | RECTAL | Status: DC | PRN

## 2019-10-20 MED ORDER — HEPARIN (PORCINE) 25000 UT/250ML-% IV SOLN
1150.0000 [IU]/h | INTRAVENOUS | Status: DC
  Administered 2019-10-20: 1000 [IU]/h via INTRAVENOUS
  Administered 2019-10-21: 1150 [IU]/h via INTRAVENOUS
  Filled 2019-10-20 (×2): qty 250

## 2019-10-20 MED ORDER — HEPARIN BOLUS VIA INFUSION
1000.0000 [IU] | Freq: Once | INTRAVENOUS | Status: AC
  Administered 2019-10-20: 1000 [IU] via INTRAVENOUS
  Filled 2019-10-20: qty 1000

## 2019-10-20 MED ORDER — ATORVASTATIN CALCIUM 10 MG PO TABS
20.0000 mg | ORAL_TABLET | Freq: Every day | ORAL | Status: DC
  Administered 2019-10-20 – 2019-10-21 (×2): 20 mg via ORAL
  Filled 2019-10-20 (×2): qty 2

## 2019-10-20 NOTE — Progress Notes (Signed)
ANTICOAGULATION CONSULT NOTE  Pharmacy Consult for heparin Indication: chest pain/ACS  Allergies  Allergen Reactions  . Contrast Media [Iodinated Diagnostic Agents]     Patient Measurements:  Weight ~ 180lb  Vital Signs: Temp: 99 F (37.2 C) (10/05 1709) Temp Source: Oral (10/05 1709) BP: 151/65 (10/05 1709) Pulse Rate: 76 (10/05 1709)  Labs: Recent Labs    10/19/19 2126 10/19/19 2259 10/20/19 0512 10/20/19 1639  HGB 11.5*  --  12.2  --   HCT 36.4  --  38.1  --   PLT 298  --  315  --   HEPARINUNFRC  --   --   --  0.23*  CREATININE 0.84  --  0.70  --   TROPONINIHS 71* 312* 170*  --     CrCl cannot be calculated (Unknown ideal weight.).   Medical History: Past Medical History:  Diagnosis Date  . SVT (supraventricular tachycardia) (HCC) 10/20/2019    Assessment: 49 yo lady with recent SVT and elevated troponins to start heparin.  She was not on anticoagulation PTA.    Initial heparin level below goal this evening (late lab draw) at 0.23 on 1000 units/hr. No bleeding issues noted, planning CTA in am.   Goal of Therapy:  Heparin level 0.3-0.7 units/ml Monitor platelets by anticoagulation protocol: Yes   Plan:  Heparin 1000 unit bolus and increase drip to 1150 units/hr Check heparin level in am Daily HL and CBC while on heparin Monitor for bleeding complications  Sheppard Coil PharmD., BCPS Clinical Pharmacist 10/20/2019 5:16 PM

## 2019-10-20 NOTE — H&P (Addendum)
History and Physical    Joyce Peters GLO:[id number] DOB: May 08, 1970 DOA: 10/19/2019  PCP: Patient, No Pcp Per  Patient coming from: Home.  Chief Complaint: Chest pain and palpitations.  HPI: Joyce Peters is a 49 y.o. female with history of tobacco abuse started experiencing chest pressure with palpitation when patient came back home last evening.  It increased with exertion no associated shortness of breath or diaphoresis.  Since it persisted EMS was called and patient was found to be in SVT.  On the route to the ER patient was given 1 dose of adenosine 6 mg IV following which patient converted to sinus rhythm.  ED Course: While in the ER patient had persistent chest pressure nonradiating with no shortness of breath.  Chest x-ray was unremarkable EKG shows normal sinus rhythm with minimal ST depression in the inferior leads.  Initial and I sensitive troponin was 71 the second 1 was 312.  D-dimer was normal and cardiology was consulted.  At this time cardiology recommended keeping patient on heparin infusion n.p.o. and aspirin and likely may need further cardiac procedure.  Covid test was negative.  Chest x-ray was unremarkable.  Review of Systems: As per HPI, rest all negative.   History reviewed. No pertinent past medical history.  Past Surgical History:  Procedure Laterality Date  . CESAREAN SECTION       reports that she has been smoking cigarettes. She has been smoking about 0.25 packs per day. She has never used smokeless tobacco. She reports previous alcohol use. She reports previous drug use.  Allergies  Allergen Reactions  . Contrast Media [Iodinated Diagnostic Agents]     Family History  Problem Relation Age of Onset  . CAD Mother     Prior to Admission medications   Medication Sig Start Date End Date Taking? Authorizing Provider  acetaminophen (TYLENOL) 500 MG tablet Take 500 mg by mouth every 6 (six) hours as needed for mild pain.   Yes [provider]  metroNIDAZOLE (FLAGYL) 500 MG tablet Take 1 tablet (500 mg total) by mouth 2 (two) times daily. Patient not taking: Reported on 10/19/2019 12/27/18   Graciella Freer A, PA-C  naproxen (NAPROSYN) 500 MG tablet Take 1 tablet (500 mg total) by mouth 2 (two) times daily. Patient not taking: Reported on 10/19/2019 09/30/18   Petrucelli, Samantha R, PA-C  ondansetron (ZOFRAN ODT) 4 MG disintegrating tablet Take 1 tablet (4 mg total) by mouth every 8 (eight) hours as needed for nausea or vomiting. Patient not taking: Reported on 10/19/2019 09/30/18   Petrucelli, Pleas Koch, PA-C    Physical Exam: Constitutional: Moderately built and nourished. Vitals:   10/19/19 2230 10/19/19 2300 10/19/19 2330 10/20/19 0000  BP: (!) 148/87 (!) 156/98 (!) 155/93 (!) 154/92  Pulse: 81 89 80 73  Resp: 19 (!) 23 15 18   Temp:      TempSrc:      SpO2: 99% 97% 98% 98%   Eyes: Anicteric no pallor. ENMT: No discharge from the ears eyes nose or mouth. Neck: No mass felt.  No neck rigidity. Respiratory: No rhonchi or crepitations. Cardiovascular: S1-S2 heard. Abdomen: Soft nontender bowel sounds present. Musculoskeletal: No edema. Skin: No rash. Neurologic: Alert awake oriented to time place and person.  Moves all extremities. Psychiatric: Appears normal.  Normal affect.   Labs on Admission: I have personally reviewed following labs and imaging studies  CBC: Recent Labs  Lab 10/19/19 2126  WBC 6.6  HGB 11.5*  HCT 36.4  MCV  86.7  PLT 298   Basic Metabolic Panel: Recent Labs  Lab 10/19/19 2126  NA 142  K 2.8*  CL 109  CO2 23  GLUCOSE 99  BUN 8  CREATININE 0.84  CALCIUM 8.1*  MG 1.5*   GFR: CrCl cannot be calculated (Unknown ideal weight.). Liver Function Tests: Recent Labs  Lab 10/19/19 2126  AST 19  ALT 19  ALKPHOS 50  BILITOT 0.5  PROT 5.9*  ALBUMIN 3.4*   No results for input(s): LIPASE, AMYLASE in the last 168 hours. No results for input(s): AMMONIA in the last 168  hours. Coagulation Profile: No results for input(s): INR, PROTIME in the last 168 hours. Cardiac Enzymes: No results for input(s): CKTOTAL, CKMB, CKMBINDEX, TROPONINI in the last 168 hours. BNP (last 3 results) No results for input(s): PROBNP in the last 8760 hours. HbA1C: No results for input(s): HGBA1C in the last 72 hours. CBG: No results for input(s): GLUCAP in the last 168 hours. Lipid Profile: No results for input(s): CHOL, HDL, LDLCALC, TRIG, CHOLHDL, LDLDIRECT in the last 72 hours. Thyroid Function Tests: No results for input(s): TSH, T4TOTAL, FREET4, T3FREE, THYROIDAB in the last 72 hours. Anemia Panel: No results for input(s): VITAMINB12, FOLATE, FERRITIN, TIBC, IRON, RETICCTPCT in the last 72 hours. Urine analysis:    Component Value Date/Time   COLORURINE YELLOW 12/27/2018 1310   APPEARANCEUR CLEAR 12/27/2018 1310   LABSPEC 1.030 12/27/2018 1310   PHURINE 5.0 12/27/2018 1310   GLUCOSEU NEGATIVE 12/27/2018 1310   HGBUR SMALL (A) 12/27/2018 1310   BILIRUBINUR NEGATIVE 12/27/2018 1310   KETONESUR NEGATIVE 12/27/2018 1310   PROTEINUR NEGATIVE 12/27/2018 1310   NITRITE NEGATIVE 12/27/2018 1310   LEUKOCYTESUR TRACE (A) 12/27/2018 1310   Sepsis Labs: @LABRCNTIP (procalcitonin:4,lacticidven:4) )No results found for this or any previous visit (from the past 240 hour(s)).   Radiological Exams on Admission: DG Chest Port 1 View  Result Date: 10/19/2019 CLINICAL DATA:  Shortness of breath. EXAM: PORTABLE CHEST 1 VIEW COMPARISON:  None. FINDINGS: The heart size and mediastinal contours are within normal limits. Both lungs are clear. The visualized skeletal structures are unremarkable. IMPRESSION: No active disease. Electronically Signed   By: 12/19/2019 M.D.   On: 10/19/2019 21:31    EKG: Independently reviewed.  Normal sinus rhythm with minimal ST depression in inferior leads.  Assessment/Plan Principal Problem:   Chest pain Active Problems:   SVT  (supraventricular tachycardia) (HCC)   Elevated troponin    1. Chest pain with elevated troponin could be from demand ischemia from SVT however since patient does have history of tobacco abuse with some ST-T changes in inferior leads concerning for non-ST relation MI for which cardiology was consulted appreciate cardiology input and heparin was requested to be started along with aspirin.  Check lipid panel.  Patient is kept n.p.o. in anticipation of procedure. 2. SVT -converted to sinus rhythm after patient was given adenosine by the EMS.  Closely monitoring telemetry.  Check thyroid function test. 3. Hypokalemia and hypomagnesemia cause not clear.  Patient did have one episode of vomiting in the ER.  Will replace potassium and magnesium recheck metabolic panel. 4. Anemia -normocytic normochromic.  Appears to be new when compared to September 2020.  Follow CBC. 5. Tobacco abuse -advised about quitting.   DVT prophylaxis: Heparin. Code Status: Full code. Family Communication: Patient's daughter at the bedside. Disposition Plan: Home. Consults called: Cardiology. Admission status: Observation.   October 2020 MD [facility] Pager [phone].  If 7PM-7AM, please  contact night-coverage [email or web address] Password [email or web address]  10/20/2019, 2:28 AM

## 2019-10-20 NOTE — Consult Note (Signed)
[facility] Consult Note   Primary Physician: No PCP Primary Cardiologist:  None  Reason for Consultation:  Palpitations and chest tightness  HPI:    Joyce Peters is a 49 year old female with no significant past medical history who was admitted to the hospital with complaints of palpitations and chest tightness.  The patient reports having a rather stressful day when she had to go to the Police department to provide bail for a family member.  She had sudden onset of palpitations associated with shortness of breath and some chest tightness. The patient was found to be in an SVT at 190 bpm by EMS.  She was treated with 6 mg of adenosine IV with restoration of normal sinus rhythm at 85 bpm.  In the ED the patient continued to endorse weakness as well as some mild chest soreness.  Initial vital signs were: Blood pressure 179/120 mmHg, heart rate 95 bpm and O2 saturation 100%.  She was given aspirin 324 mg, Zofran, potassium chloride, and magnesium oxide.  High-sensitivity troponins were 71 and 312.  D-dimer was 0.45.  Other labs showed: Potassium 2.8, creatinine 0.84, magnesium 1.5, albumin 3.4, WBC 6.6, hemoglobin 11.5 and platelets 298.  The electrocardiogram on 10/19/2019 (20:23) showed normal sinus rhythm, ventricular rate 88 bpm, with minimal ST depressions in the inferolateral leads.     Home Medications Prior to Admission medications   Medication Sig Start Date End Date Taking? Authorizing Provider  acetaminophen (TYLENOL) 500 MG tablet Take 500 mg by mouth every 6 (six) hours as needed for mild pain.   Yes [provider]  metroNIDAZOLE (FLAGYL) 500 MG tablet Take 1 tablet (500 mg total) by mouth 2 (two) times daily. Patient not taking: Reported on 10/19/2019 12/27/18   Graciella Freer A, PA-C  naproxen (NAPROSYN) 500 MG tablet Take 1 tablet (500 mg total) by mouth 2 (two) times daily. Patient not taking: Reported on 10/19/2019 09/30/18   Petrucelli, Samantha R, PA-C    ondansetron (ZOFRAN ODT) 4 MG disintegrating tablet Take 1 tablet (4 mg total) by mouth every 8 (eight) hours as needed for nausea or vomiting. Patient not taking: Reported on 10/19/2019 09/30/18   Petrucelli, Pleas Koch, PA-C    Past Medical History: History reviewed. No pertinent past medical history.  Past Surgical History: Past Surgical History:  Procedure Laterality Date  . CESAREAN SECTION      Family History: Family History  Problem Relation Age of Onset  . CAD Mother     Social History: Social History   Socioeconomic History  . Marital status: Single    Spouse name: Not on file  . Number of children: Not on file  . Years of education: Not on file  . Highest education level: Not on file  Occupational History  . Not on file  Tobacco Use  . Smoking status: Current Every Day Smoker    Packs/day: 0.25    Types: Cigarettes  . Smokeless tobacco: Never Used  Vaping Use  . Vaping Use: Never used  Substance and Sexual Activity  . Alcohol use: Not Currently  . Drug use: Not Currently  . Sexual activity: Not on file  Other Topics Concern  . Not on file  Social History Narrative  . Not on file   Social Determinants of Health   Financial Resource Strain:   . Difficulty of Paying Living Expenses: Not on file  Food Insecurity:   . Worried About Programme researcher, broadcasting/film/video in the Last Year: Not on  file  . Ran Out of Food in the Last Year: Not on file  Transportation Needs:   . Lack of Transportation (Medical): Not on file  . Lack of Transportation (Non-Medical): Not on file  Physical Activity:   . Days of Exercise per Week: Not on file  . Minutes of Exercise per Session: Not on file  Stress:   . Feeling of Stress : Not on file  Social Connections:   . Frequency of Communication with Friends and Family: Not on file  . Frequency of Social Gatherings with Friends and Family: Not on file  . Attends Religious Services: Not on file  . Active Member of Clubs or Organizations:  Not on file  . Attends Banker Meetings: Not on file  . Marital Status: Not on file    Allergies:  Allergies  Allergen Reactions  . Contrast Media [Iodinated Diagnostic Agents]      Review of Systems: [y] = yes, [ ]  = no   . General: Weight gain [ ] ; Weight loss [ ] ; Anorexia [ ] ; Fatigue [ ] ; Fever [ ] ; Chills [ ] ; Weakness [ ]   . Cardiac: Chest pain/pressure [Y]; Resting SOB [ ] ; Exertional SOB [ ] ; Orthopnea [ ] ; Pedal Edema [ ] ; Palpitations [ ] ; Syncope [ ] ; Presyncope [ ] ; Paroxysmal nocturnal dyspnea[ ]   . Pulmonary: Cough [ ] ; Wheezing[ ] ; Hemoptysis[ ] ; Sputum [ ] ; Snoring [ ]   . GI: Vomiting[ ] ; Dysphagia[ ] ; Melena[ ] ; Hematochezia [ ] ; Heartburn[ ] ; Abdominal pain [ ] ; Constipation [ ] ; Diarrhea [ ] ; BRBPR [ ]   . GU: Hematuria[ ] ; Dysuria [ ] ; Nocturia[ ]   . Vascular: Pain in legs with walking [ ] ; Pain in feet with lying flat [ ] ; Non-healing sores [ ] ; Stroke [ ] ; TIA [ ] ; Slurred speech [ ] ;  . Neuro: Headaches[ ] ; Vertigo[ ] ; Seizures[ ] ; Paresthesias[ ] ;Blurred vision [ ] ; Diplopia [ ] ; Vision changes [ ]   . Ortho/Skin: Arthritis [ ] ; Joint pain [ ] ; Muscle pain [ ] ; Joint swelling [ ] ; Back Pain [ ] ; Rash [ ]   . Psych: Depression[ ] ; Anxiety[ ]   . Heme: Bleeding problems [ ] ; Clotting disorders [ ] ; Anemia [ ]   . Endocrine: Diabetes [ ] ; Thyroid dysfunction[ ]      Objective:    Vital Signs:   Temp:  [97.8 F (36.6 C)] 97.8 F (36.6 C) (10/04 2029) Pulse Rate:  [63-95] 63 (10/05 0400) Resp:  [10-27] 17 (10/05 0400) BP: (142-179)/(87-120) 158/99 (10/05 0400) SpO2:  [94 %-100 %] 100 % (10/05 0400)    Weight change: There were no vitals filed for this visit.  Intake/Output:  No intake or output data in the 24 hours ending 10/20/19 0429    Physical Exam    General:  Well appearing. No resp difficulty HEENT: normal Neck: supple. JVP . Carotids 2+ bilat; no bruits. No lymphadenopathy or thyromegaly appreciated. Cor: PMI nondisplaced. Regular  rate & rhythm. No rubs, gallops or murmurs. Lungs: clear Abdomen: soft, nontender, nondistended. No hepatosplenomegaly. No bruits or masses. Good bowel sounds. Extremities: no cyanosis, clubbing, rash, edema Neuro: alert & orientedx3, cranial nerves grossly intact. moves all 4 extremities w/o difficulty. Affect pleasant    EKG     The electrocardiogram on 10/19/2019 (20:23), that I reviewed personally, showed normal sinus rhythm, ventricular rate 88 bpm, with minimal ST depressions in the inferolateral leads.   Labs   Basic Metabolic Panel: Recent Labs  Lab 10/19/19 2126  NA  142  K 2.8*  CL 109  CO2 23  GLUCOSE 99  BUN 8  CREATININE 0.84  CALCIUM 8.1*  MG 1.5*    Liver Function Tests: Recent Labs  Lab 10/19/19 2126  AST 19  ALT 19  ALKPHOS 50  BILITOT 0.5  PROT 5.9*  ALBUMIN 3.4*   No results for input(s): LIPASE, AMYLASE in the last 168 hours. No results for input(s): AMMONIA in the last 168 hours.  CBC: Recent Labs  Lab 10/19/19 2126  WBC 6.6  HGB 11.5*  HCT 36.4  MCV 86.7  PLT 298    Cardiac Enzymes: No results for input(s): CKTOTAL, CKMB, CKMBINDEX, TROPONINI in the last 168 hours.  BNP: BNP (last 3 results) No results for input(s): BNP in the last 8760 hours.  ProBNP (last 3 results) No results for input(s): PROBNP in the last 8760 hours.   CBG: No results for input(s): GLUCAP in the last 168 hours.  Coagulation Studies: No results for input(s): LABPROT, INR in the last 72 hours.   Imaging   DG Chest Port 1 View  Result Date: 10/19/2019 CLINICAL DATA:  Shortness of breath. EXAM: PORTABLE CHEST 1 VIEW COMPARISON:  None. FINDINGS: The heart size and mediastinal contours are within normal limits. Both lungs are clear. The visualized skeletal structures are unremarkable. IMPRESSION: No active disease. Electronically Signed   By: Katherine Mantle M.D.   On: 10/19/2019 21:31      Medications:     Current Medications: . aspirin EC   81 mg Oral Daily  . enoxaparin (LOVENOX) injection  40 mg Subcutaneous Q24H      Assessment/Plan    1. Supraventricular tachycardia  The patient had a short-lived episode of SVT that responded to 6 mg IV adenosine given by EMS.  Patient has not had any recurrences since being in the hospital.  -Monitor on Telemetry -Maintain K > 4.0 and Mg >2.0 -Use adenosine or AV nodal blockers if patient has recurrent SVT   2. Elevated troponin  This could be from demand ischemia due to the episode of SVT.  After restoration of normal sinus rhythm the ECG did show some mild ST depressions in the lateral leads.  The high-sensitivity troponins are also abnormal.  -Continue to cycle cardiac enzymes -Serial ECGs -Aspirin 81 mg daily -Unfractionated heparin IV infusion -Check a lipid panel -Echocardiogram to evaluate LV function -Keep patient NPO  Patient will likely require further work-up with either a nuclear stress test or cardiac cath to rule out obstructive CAD.    Lonie Peak, MD  10/20/2019, 4:29 AM  Cardiology Overnight Team Please contact [facility] for night-coverage after hours (4p -7a ) and weekends on [email or web address]

## 2019-10-20 NOTE — Progress Notes (Addendum)
   Patient admitted earlier this morning for SVT converted back to sinus rhythm with Adenosine. She did have associated chest pain and shortness of breath with this. High-sensitivity troponin peaked in the 300's and is trending down. Likely demand ischemia.  Patient feeling well this morning - just tired. No recurrent palpitations, chest pain, or shortness of breath. No recurrent SVT on telemetry. She is in sinus rhythm with rates ranging from the 50's to 80's.   General: 49 y.o. female resting comfortably in no acute distress. HEENT: Normocephalic and atraumatic.  Neck: Supple.  Heart: RRR. Distinct S1 and S2. No murmurs, gallops, or rubs. Radial pulses 2+ and equal bilaterally. Lungs: No increased work of breathing. Clear to ausculation bilaterally. No wheezes, rhonchi, or rales.  Abdomen: Soft, non-distended, and non-tender to palpation.  Extremities: No edema.    Skin: Warm and dry. Neuro: No focal deficits. Psych: Normal affect. Responds appropriately.  Plan is for Echo today and coronary CTA tomorrow due to contrast allergy and premedication requirements. Of note, LDL is elevated at 178. If coronary CTA shows any disease, I would start statin.   Corrin Parker, PA-C 10/20/2019 10:39 AM  ATTENDING ATTESTATION  I have seen, examined and evaluated the patient this AM along with Marjie Skiff, PA.  After reviewing all the available data and chart, we discussed the patients laboratory, study & physical findings as well as symptoms in detail. I agree with her findings, examination as well as impression recommendations as per our discussion.    She is feeling much better today.  No further chest pain or pressure.  Spells.  Blood pressure have been a little bit high, and heart rate has been 50s to 60s.  Exam essentially benign.  Her EKGs in the setting of SVT did show mild ST elevation in aVR as well as ST depressions in anterior and inferolateral leads that would be concerning for  ischemia.  She did have elevated troponin which does sound be more consistent with demand ischemia in the setting of SVT, however cannot exclude ischemia.  Do think ischemic evaluation is warranted.  She has an echocardiogram pending for today.  Unfortunately were unable to get coronary CTA scan done today because of her contrast hypersensitivity.  I left with him tomorrow.  I do agree with smoking cessation counseling, and probably initiation of treatment for hyperlipidemia pending results of coronary CTA.  Follow-up tomorrow after coronary CTA results completed.    Bryan Lemma, M.D., M.S. Interventional Cardiologist   Pager # [phone] Phone # [phone] [address] [place], [place] [address]

## 2019-10-20 NOTE — Progress Notes (Signed)
ANTICOAGULATION CONSULT NOTE - Initial Consult  Pharmacy Consult for heparin Indication: chest pain/ACS  Allergies  Allergen Reactions  . Contrast Media [Iodinated Diagnostic Agents]     Patient Measurements:  Weight ~ 180lb  Vital Signs: Temp: 97.8 F (36.6 C) (10/04 2029) Temp Source: Oral (10/04 2029) BP: 158/99 (10/05 0500) Pulse Rate: 71 (10/05 0500)  Labs: Recent Labs    10/19/19 2126 10/19/19 2259  HGB 11.5*  --   HCT 36.4  --   PLT 298  --   CREATININE 0.84  --   TROPONINIHS 71* 312*    CrCl cannot be calculated (Unknown ideal weight.).   Medical History: History reviewed. No pertinent past medical history.  Medications:  See medication history  Assessment: 49 yo lady with recent SVT and elevated troponins to start heparin.  She was not on anticoagulation PTA.  Hg 11.5, PTLC 298  Goal of Therapy:  Heparin level 0.3-0.7 units/ml Monitor platelets by anticoagulation protocol: Yes   Plan:  Heparin 4000 unit bolus and drip at 1000 units/hr Check heparin level ~ 6 hours after start Daily HL and CBC while on heparin Monitor for bleeding complications  Joyce Peters 10/20/2019,5:57 AM

## 2019-10-20 NOTE — Progress Notes (Signed)
PROGRESS NOTE    Joyce Peters   DXI:[id number]  DOB: [birth year]  DOA: 10/19/2019 PCP: Patient, No Pcp Per   Brief Narrative:  Joyce Peters is a 49 y.o. female with history of tobacco abuse started experiencing chest pressure with palpitation when patient came back home last evening.  It increased with exertion no associated shortness of breath or diaphoresis.  Since it persisted EMS was called and patient was found to be in SVT.  On the route to the ER patient was given 1 dose of adenosine 6 mg IV following which patient converted to sinus rhythm.  In Ed> troponin 71, started on Heparin infusion and cardiology consulted   Subjective: No further symptoms. Noted to have HTN (on monitor). States no prior h/o HTN. She has gained "a lot" of weight in the past year. She has not seen a doctor in over 2 yrs and smokes about 1 ppd.     Assessment & Plan:   Principal Problem:   Chest pain   SVT (supraventricular tachycardia)    Elevated troponin - max troponin 312- cont Heparin- per cardiology, she will need CTA coronaries per cardiology (after a 13 steroid prep due to dye allergy)- f/u study tomorrow please - LDL 178- will need to start a low fat diet- start statin- consult dietician for education - 2 D ECHO noted below   Nicotine abuse - start Nicotine patch- counseled   HTN - I have advised her to either get this rechecked in a couple of weeks or buy a BP monitor and check it daily at home to see if it remains elevated  Time spent in minutes: 30 DVT prophylaxis:  Heparin infusion Code Status: full code Family Communication:  Disposition Plan:  Status is: Observation  The patient remains OBS appropriate and will d/c before 2 midnights. Will likely d/c tomorrow if CTA negative  Dispo: The patient is from: Home              Anticipated d/c is to: Home              Anticipated d/c date is: 1 day              Patient currently is not medically stable to  d/c.      Consultants:   Cardiology  Procedures:  ECHO 1. Left ventricular ejection fraction, by estimation, is 60 to 65%. Left  ventricular ejection fraction by 2D MOD biplane is 64.5 %. The left  ventricle has normal function. The left ventricle has no regional wall  motion abnormalities. Left ventricular  diastolic parameters were normal.  2. Right ventricular systolic function is normal. The right ventricular  size is normal. There is normal pulmonary artery systolic pressure.  3. Left atrial size was mildly dilated.  4. The mitral valve is normal in structure. Trivial mitral valve  regurgitation. No evidence of mitral stenosis.  5. The aortic valve is tricuspid. Aortic valve regurgitation is not  visualized. No aortic stenosis is present.  6. The inferior vena cava is normal in size with greater than 50%  respiratory variability, suggesting right atrial pressure of 3 mmHg.    Antimicrobials:  Anti-infectives (From admission, onward)   None       Objective: Vitals:   10/20/19 1100 10/20/19 1115 10/20/19 1130 10/20/19 1430  BP:    (!) 144/73  Pulse: 61 62 68 64  Resp: 19 18 17 17   Temp:    98.6 F (37 C)  TempSrc:  Oral  SpO2: 98% 99% 99% 99%    Intake/Output Summary (Last 24 hours) at 10/20/2019 1659 Last data filed at 10/20/2019 1500 Gross per 24 hour  Intake 128.81 ml  Output --  Net 128.81 ml   There were no vitals filed for this visit.  Examination: General exam: Appears comfortable  HEENT: PERRLA, oral mucosa moist, no sclera icterus or thrush Respiratory system: Clear to auscultation. Respiratory effort normal. Cardiovascular system: S1 & S2 heard, RRR.   Gastrointestinal system: Abdomen soft, non-tender, nondistended. Normal bowel sounds. Central nervous system: Alert and oriented. No focal neurological deficits. Extremities: No cyanosis, clubbing or edema Skin: No rashes or ulcers Psychiatry:  Mood & affect appropriate.     Data  Reviewed: I have personally reviewed following labs and imaging studies  CBC: Recent Labs  Lab 10/19/19 2126 10/20/19 0512  WBC 6.6 6.7  HGB 11.5* 12.2  HCT 36.4 38.1  MCV 86.7 87.4  PLT 298 315   Basic Metabolic Panel: Recent Labs  Lab 10/19/19 2126 10/20/19 0512  NA 142 140  K 2.8* 4.1  CL 109 106  CO2 23 23  GLUCOSE 99 97  BUN 8 8  CREATININE 0.84 0.70  CALCIUM 8.1* 9.1  MG 1.5*  --    GFR: CrCl cannot be calculated (Unknown ideal weight.). Liver Function Tests: Recent Labs  Lab 10/19/19 2126  AST 19  ALT 19  ALKPHOS 50  BILITOT 0.5  PROT 5.9*  ALBUMIN 3.4*   No results for input(s): LIPASE, AMYLASE in the last 168 hours. No results for input(s): AMMONIA in the last 168 hours. Coagulation Profile: No results for input(s): INR, PROTIME in the last 168 hours. Cardiac Enzymes: No results for input(s): CKTOTAL, CKMB, CKMBINDEX, TROPONINI in the last 168 hours. BNP (last 3 results) No results for input(s): PROBNP in the last 8760 hours. HbA1C: No results for input(s): HGBA1C in the last 72 hours. CBG: No results for input(s): GLUCAP in the last 168 hours. Lipid Profile: Recent Labs    10/20/19 0512  CHOL 237*  HDL 44  LDLCALC 178*  TRIG 73  CHOLHDL 5.4   Thyroid Function Tests: Recent Labs    10/20/19 0512  TSH 0.884  FREET4 0.86   Anemia Panel: No results for input(s): VITAMINB12, FOLATE, FERRITIN, TIBC, IRON, RETICCTPCT in the last 72 hours. Urine analysis:    Component Value Date/Time   COLORURINE YELLOW 12/27/2018 1310   APPEARANCEUR CLEAR 12/27/2018 1310   LABSPEC 1.030 12/27/2018 1310   PHURINE 5.0 12/27/2018 1310   GLUCOSEU NEGATIVE 12/27/2018 1310   HGBUR SMALL (A) 12/27/2018 1310   BILIRUBINUR NEGATIVE 12/27/2018 1310   KETONESUR NEGATIVE 12/27/2018 1310   PROTEINUR NEGATIVE 12/27/2018 1310   NITRITE NEGATIVE 12/27/2018 1310   LEUKOCYTESUR TRACE (A) 12/27/2018 1310   Sepsis  Labs: @LABRCNTIP (procalcitonin:4,lacticidven:4) ) Recent Results (from the past 240 hour(s))  Respiratory Panel by RT PCR (Flu A&B, Covid) - Nasopharyngeal Swab     Status: None   Collection Time: 10/20/19  2:18 AM   Specimen: Nasopharyngeal Swab  Result Value Ref Range Status   SARS Coronavirus 2 by RT PCR NEGATIVE NEGATIVE Final    Comment: (NOTE) SARS-CoV-2 target nucleic acids are NOT DETECTED.  The SARS-CoV-2 RNA is generally detectable in upper respiratoy specimens during the acute phase of infection. The lowest concentration of SARS-CoV-2 viral copies this assay can detect is 131 copies/mL. A negative result does not preclude SARS-Cov-2 infection and should not be used as the sole basis for  treatment or other patient management decisions. A negative result may occur with  improper specimen collection/handling, submission of specimen other than nasopharyngeal swab, presence of viral mutation(s) within the areas targeted by this assay, and inadequate number of viral copies (<131 copies/mL). A negative result must be combined with clinical observations, patient history, and epidemiological information. The expected result is Negative.  Fact Sheet for Patients:  https://www.moore.com/https://www.fda.gov/media/142436/download  Fact Sheet for Healthcare Providers:  https://www.young.biz/https://www.fda.gov/media/142435/download  This test is no t yet approved or cleared by the [place] FDA and  has been authorized for detection and/or diagnosis of SARS-CoV-2 by FDA under an Emergency Use Authorization (EUA). This EUA will remain  in effect (meaning this test can be used) for the duration of the COVID-19 declaration under Section 564(b)(1) of the Act, 21 U.S.C. section 360bbb-3(b)(1), unless the authorization is terminated or revoked sooner.     Influenza A by PCR NEGATIVE NEGATIVE Final   Influenza B by PCR NEGATIVE NEGATIVE Final    Comment: (NOTE) The Xpert Xpress SARS-CoV-2/FLU/RSV assay is intended as an  aid in  the diagnosis of influenza from Nasopharyngeal swab specimens and  should not be used as a sole basis for treatment. Nasal washings and  aspirates are unacceptable for Xpert Xpress SARS-CoV-2/FLU/RSV  testing.  Fact Sheet for Patients: https://www.moore.com/https://www.fda.gov/media/142436/download  Fact Sheet for Healthcare Providers: https://www.young.biz/https://www.fda.gov/media/142435/download  This test is not yet approved or cleared by the [place] FDA and  has been authorized for detection and/or diagnosis of SARS-CoV-2 by  FDA under an Emergency Use Authorization (EUA). This EUA will remain  in effect (meaning this test can be used) for the duration of the  Covid-19 declaration under Section 564(b)(1) of the Act, 21  U.S.C. section 360bbb-3(b)(1), unless the authorization is  terminated or revoked. Performed at [facility], [address] [place], [place] [id number]          Radiology Studies: DG Chest Port 1 View  Result Date: 10/19/2019 CLINICAL DATA:  Shortness of breath. EXAM: PORTABLE CHEST 1 VIEW COMPARISON:  None. FINDINGS: The heart size and mediastinal contours are within normal limits. Both lungs are clear. The visualized skeletal structures are unremarkable. IMPRESSION: No active disease. Electronically Signed   By: Katherine Mantlehristopher  Green M.D.   On: 10/19/2019 21:31   ECHOCARDIOGRAM COMPLETE  Result Date: 10/20/2019    ECHOCARDIOGRAM REPORT   Patient Name:   Armen PickupSAYYIDAH Creelman Date of Exam: 10/20/2019 Medical Rec #:  [id number]        Height: Accession #:    [id number]       Weight:       180.0 lb Date of Birth:  08/10/1970         BSA:          1.882 m Patient Age:    49 years         BP:           146/75 mmHg Patient Gender: F                HR:           63 bpm. Exam Location:  Inpatient Procedure: 2D Echo, Cardiac Doppler and Color Doppler Indications:    Elevated Troponin                 Chest Pain 786.50 / R07.9  History:        Patient has no prior history of Echocardiogram examinations.  Risk Factors:Current Smoker.  Sonographer:    Renella Cunas RDCS Referring Phys: [id number] CALLIE E GOODRICH IMPRESSIONS  1. Left ventricular ejection fraction, by estimation, is 60 to 65%. Left ventricular ejection fraction by 2D MOD biplane is 64.5 %. The left ventricle has normal function. The left ventricle has no regional wall motion abnormalities. Left ventricular diastolic parameters were normal.  2. Right ventricular systolic function is normal. The right ventricular size is normal. There is normal pulmonary artery systolic pressure.  3. Left atrial size was mildly dilated.  4. The mitral valve is normal in structure. Trivial mitral valve regurgitation. No evidence of mitral stenosis.  5. The aortic valve is tricuspid. Aortic valve regurgitation is not visualized. No aortic stenosis is present.  6. The inferior vena cava is normal in size with greater than 50% respiratory variability, suggesting right atrial pressure of 3 mmHg. FINDINGS  Left Ventricle: Left ventricular ejection fraction, by estimation, is 60 to 65%. Left ventricular ejection fraction by 2D MOD biplane is 64.5 %. The left ventricle has normal function. The left ventricle has no regional wall motion abnormalities. The left ventricular internal cavity size was normal in size. There is no left ventricular hypertrophy. Left ventricular diastolic parameters were normal. Indeterminate filling pressures. Right Ventricle: The right ventricular size is normal. No increase in right ventricular wall thickness. Right ventricular systolic function is normal. There is normal pulmonary artery systolic pressure. The tricuspid regurgitant velocity is 1.20 m/s, and  with an assumed right atrial pressure of 3 mmHg, the estimated right ventricular systolic pressure is 8.8 mmHg. Left Atrium: Left atrial size was mildly dilated. Right Atrium: Right atrial size was normal in size. Pericardium: There is no evidence of pericardial effusion. Mitral Valve:  The mitral valve is normal in structure. Trivial mitral valve regurgitation. No evidence of mitral valve stenosis. Tricuspid Valve: The tricuspid valve is normal in structure. Tricuspid valve regurgitation is trivial. No evidence of tricuspid stenosis. Aortic Valve: The aortic valve is tricuspid. Aortic valve regurgitation is not visualized. No aortic stenosis is present. Pulmonic Valve: The pulmonic valve was normal in structure. Pulmonic valve regurgitation is not visualized. No evidence of pulmonic stenosis. Aorta: The aortic root is normal in size and structure. Venous: The inferior vena cava is normal in size with greater than 50% respiratory variability, suggesting right atrial pressure of 3 mmHg. IAS/Shunts: No atrial level shunt detected by color flow Doppler.  LEFT VENTRICLE PLAX 2D                        Biplane EF (MOD) LVIDd:         5.10 cm         LV Biplane EF:   Left LVIDs:         2.90 cm                          ventricular LV PW:         0.90 cm                          ejection LV IVS:        0.90 cm                          fraction by LVOT diam:     2.10 cm  2D MOD LV SV:         69                               biplane is LV SV Index:   37                               64.5 %. LVOT Area:     3.46 cm                                Diastology                                LV e' medial:    6.85 cm/s LV Volumes (MOD)               LV E/e' medial:  10.4 LV vol d, MOD    93.2 ml       LV e' lateral:   11.30 cm/s A2C:                           LV E/e' lateral: 6.3 LV vol d, MOD    76.2 ml A4C: LV vol s, MOD    34.0 ml A2C: LV vol s, MOD    25.1 ml A4C: LV SV MOD A2C:   59.2 ml LV SV MOD A4C:   76.2 ml LV SV MOD BP:    54.9 ml RIGHT VENTRICLE RV S prime:     10.20 cm/s TAPSE (M-mode): 2.7 cm LEFT ATRIUM             Index       RIGHT ATRIUM           Index LA diam:        3.70 cm 1.97 cm/m  RA Area:     14.10 cm LA Vol (A2C):   48.2 ml 25.61 ml/m RA Volume:   34.00 ml   18.06 ml/m LA Vol (A4C):   18.4 ml 9.78 ml/m LA Biplane Vol: 32.7 ml 17.37 ml/m  AORTIC VALVE LVOT Vmax:   75.10 cm/s LVOT Vmean:  54.000 cm/s LVOT VTI:    0.199 m  AORTA Ao Root diam: 2.80 cm MITRAL VALVE               TRICUSPID VALVE MV Area (PHT): 2.91 cm    TR Peak grad:   5.8 mmHg MV Decel Time: 261 msec    TR Vmax:        120.00 cm/s MV E velocity: 71.10 cm/s MV A velocity: 62.10 cm/s  SHUNTS MV E/A ratio:  1.14        Systemic VTI:  0.20 m                            Systemic Diam: 2.10 cm Chilton Si MD Electronically signed by Chilton Si MD Signature Date/Time: 10/20/2019/2:31:34 PM    Final       Scheduled Meds: . aspirin EC  81 mg Oral Daily  . [START ON 10/21/2019] diphenhydrAMINE  50 mg Oral Once  . predniSONE  50 mg Oral Q6H   Continuous Infusions: . heparin 1,000 Units/hr (10/20/19 1610)  LOS: 0 days      Calvert Cantor, MD [facility] Pager: [email or web address] 10/20/2019, 4:59 PM

## 2019-10-20 NOTE — Progress Notes (Signed)
  Echocardiogram 2D Echocardiogram has been performed.  Joyce Peters 10/20/2019, 2:06 PM

## 2019-10-20 NOTE — ED Provider Notes (Signed)
49 yo F I received in signout from Dr. Pilar Plate.  Briefly she is a 49 year old female with a chief complaints of SVT.  Had some chest pain and fatigue with it.  Lab work with troponin was mildly elevated.  Plan is for a repeat troponin and D-dimer.  D-dimer is resulted negative.  Second troponin has gone into the three hundreds.  My review of the EKG with possible ST depression inferior and laterally.  Will discuss with cardiology give a dose of aspirin.  Patient endorses history of smoking hyperlipidemia.  Hypertensive here in the ED.  No family history of MI.  Based on hx and ECG cards fellow Dr. Deforest Hoyles feels that she likely needs further work-up in the hospital.  He felt this could have been due to a prolonged episode of SVT.  Recommended hospitalist admission and he will consult.  CRITICAL CARE Performed by: Rae Roam   Total critical care time: 35 minutes  Critical care time was exclusive of separately billable procedures and treating other patients.  Critical care was necessary to treat or prevent imminent or life-threatening deterioration.  Critical care was time spent personally by me on the following activities: development of treatment plan with patient and/or surrogate as well as nursing, discussions with consultants, evaluation of patient's response to treatment, examination of patient, obtaining history from patient or surrogate, ordering and performing treatments and interventions, ordering and review of laboratory studies, ordering and review of radiographic studies, pulse oximetry and re-evaluation of patient's condition.    Melene Plan, DO 10/20/19 0234

## 2019-10-20 NOTE — ED Notes (Signed)
Date and time results received: 10/20/19 0135 (use smartphrase ".now" to insert current time)  Test: Trop Critical Value: 312  Name of Provider Notified: Bero  Orders Received? Or Actions Taken?: Continue to monitor

## 2019-10-21 ENCOUNTER — Observation Stay (HOSPITAL_COMMUNITY): Payer: Self-pay

## 2019-10-21 ENCOUNTER — Other Ambulatory Visit (HOSPITAL_COMMUNITY): Payer: Self-pay | Admitting: Internal Medicine

## 2019-10-21 DIAGNOSIS — R079 Chest pain, unspecified: Secondary | ICD-10-CM

## 2019-10-21 MED ORDER — METOPROLOL TARTRATE 5 MG/5ML IV SOLN
INTRAVENOUS | Status: AC
  Administered 2019-10-21: 5 mg via INTRAVENOUS
  Filled 2019-10-21: qty 10

## 2019-10-21 MED ORDER — NITROGLYCERIN 0.4 MG SL SUBL
SUBLINGUAL_TABLET | SUBLINGUAL | Status: AC
  Administered 2019-10-21: 0.8 mg via SUBLINGUAL
  Filled 2019-10-21: qty 2

## 2019-10-21 MED ORDER — METOPROLOL TARTRATE 5 MG/5ML IV SOLN: 5.0000 mg | Freq: Once | INTRAVENOUS | Status: AC

## 2019-10-21 MED ORDER — IOHEXOL 350 MG/ML SOLN
80.0000 mL | Freq: Once | INTRAVENOUS | Status: AC | PRN
  Administered 2019-10-21: 80 mL via INTRAVENOUS

## 2019-10-21 MED ORDER — ATORVASTATIN CALCIUM 20 MG PO TABS
20.0000 mg | ORAL_TABLET | Freq: Every day | ORAL | 0 refills | Status: DC
Start: 2019-10-22 — End: 2020-01-26

## 2019-10-21 MED ORDER — LOSARTAN POTASSIUM 25 MG PO TABS: 25.0000 mg | ORAL_TABLET | Freq: Every day | ORAL | 0 refills | Status: DC

## 2019-10-21 MED ORDER — NITROGLYCERIN 0.4 MG SL SUBL: 0.8000 mg | SUBLINGUAL_TABLET | Freq: Once | SUBLINGUAL | Status: AC

## 2019-10-21 MED FILL — LOSARTAN POTASSIUM 25 MG TA: 25 | 30 days supply | Qty: 30 | Fill #0

## 2019-10-21 MED FILL — ATORVASTATIN CALCIUM 20 MG: 20 | 30 days supply | Qty: 30 | Fill #0

## 2019-10-21 NOTE — Progress Notes (Signed)
Progress Note  Patient Name: Melanny Wire Date of Encounter: 10/21/2019  [facility] Cardiologist: No primary care provider on file.   Subjective   Sitting up on the side of the bed.   Inpatient Medications    Scheduled Meds: . aspirin EC  81 mg Oral Daily  . atorvastatin  20 mg Oral Daily   Continuous Infusions: . heparin 1,150 Units/hr (10/21/19 0148)   PRN Meds: acetaminophen **OR** acetaminophen, nitroGLYCERIN, ondansetron **OR** ondansetron (ZOFRAN) IV   Vital Signs    Vitals:   10/20/19 1430 10/20/19 1709 10/20/19 1724 10/21/19 0027  BP: (!) 144/73 (!) 151/65  (!) 152/93  Pulse: 64 76  72  Resp: 17 17  15   Temp: 98.6 F (37 C) 99 F (37.2 C)  98 F (36.7 C)  TempSrc: Oral Oral  Oral  SpO2: 99% 98%  96%  Weight:  81.6 kg 98.7 kg   Height:   5\' 7"  (1.702 m)     Intake/Output Summary (Last 24 hours) at 10/21/2019 1027 Last data filed at 10/20/2019 1500 Gross per 24 hour  Intake 128.81 ml  Output --  Net 128.81 ml   Last 3 Weights 10/20/2019 10/20/2019 12/27/2018  Weight (lbs) 217 lb 8 oz 180 lb 180 lb  Weight (kg) 98.657 kg 81.647 kg 81.647 kg      Telemetry    SR HR 50-90 - Personally Reviewed  ECG    No new tracing  Physical Exam  Pleasant younger AAF GEN: No acute distress.   Neck: No JVD Cardiac: RRR, no murmurs, rubs, or gallops.  Respiratory: Clear to auscultation bilaterally. GI: Soft, nontender, non-distended  MS: No edema; No deformity. Neuro:  Nonfocal  Psych: Normal affect   Labs    High Sensitivity Troponin:   Recent Labs  Lab 10/19/19 2126 10/19/19 2259 10/20/19 0512  TROPONINIHS 71* 312* 170*      Chemistry Recent Labs  Lab 10/19/19 2126 10/20/19 0512  NA 142 140  K 2.8* 4.1  CL 109 106  CO2 23 23  GLUCOSE 99 97  BUN 8 8  CREATININE 0.84 0.70  CALCIUM 8.1* 9.1  PROT 5.9*  --   ALBUMIN 3.4*  --   AST 19  --   ALT 19  --   ALKPHOS 50  --   BILITOT 0.5  --   GFRNONAA >60 >60  GFRAA >60 >60   ANIONGAP 10 11     Hematology Recent Labs  Lab 10/19/19 2126 10/20/19 0512  WBC 6.6 6.7  RBC 4.20 4.36  HGB 11.5* 12.2  HCT 36.4 38.1  MCV 86.7 87.4  MCH 27.4 28.0  MCHC 31.6 32.0  RDW 13.5 13.7  PLT 298 315    BNPNo results for input(s): BNP, PROBNP in the last 168 hours.   DDimer  Recent Labs  Lab 10/19/19 2259  DDIMER 0.45     Radiology    DG Chest Port 1 View  Result Date: 10/19/2019 CLINICAL DATA:  Shortness of breath. EXAM: PORTABLE CHEST 1 VIEW COMPARISON:  None. FINDINGS: The heart size and mediastinal contours are within normal limits. Both lungs are clear. The visualized skeletal structures are unremarkable. IMPRESSION: No active disease. Electronically Signed   By: [id number] M.D.   On: 10/19/2019 21:31   ECHOCARDIOGRAM COMPLETE  Result Date: 10/20/2019    ECHOCARDIOGRAM REPORT   Patient Name:   KELSEI DEFINO Date of Exam: 10/20/2019 Medical Rec #:  Armen Pickup        Height:  Accession #:    [id number]       Weight:       180.0 lb Date of Birth:  10/03/1970         BSA:          1.882 m Patient Age:    49 years         BP:           146/75 mmHg Patient Gender: F                HR:           63 bpm. Exam Location:  Inpatient Procedure: 2D Echo, Cardiac Doppler and Color Doppler Indications:    Elevated Troponin                 Chest Pain 786.50 / R07.9  History:        Patient has no prior history of Echocardiogram examinations.                 Risk Factors:Current Smoker.  Sonographer:    Renella CunasJulia Swaim RDCS Referring Phys: [id number] CALLIE E GOODRICH IMPRESSIONS  1. Left ventricular ejection fraction, by estimation, is 60 to 65%. Left ventricular ejection fraction by 2D MOD biplane is 64.5 %. The left ventricle has normal function. The left ventricle has no regional wall motion abnormalities. Left ventricular diastolic parameters were normal.  2. Right ventricular systolic function is normal. The right ventricular size is normal. There is normal pulmonary artery  systolic pressure.  3. Left atrial size was mildly dilated.  4. The mitral valve is normal in structure. Trivial mitral valve regurgitation. No evidence of mitral stenosis.  5. The aortic valve is tricuspid. Aortic valve regurgitation is not visualized. No aortic stenosis is present.  6. The inferior vena cava is normal in size with greater than 50% respiratory variability, suggesting right atrial pressure of 3 mmHg. FINDINGS  Left Ventricle: Left ventricular ejection fraction, by estimation, is 60 to 65%. Left ventricular ejection fraction by 2D MOD biplane is 64.5 %. The left ventricle has normal function. The left ventricle has no regional wall motion abnormalities. The left ventricular internal cavity size was normal in size. There is no left ventricular hypertrophy. Left ventricular diastolic parameters were normal. Indeterminate filling pressures. Right Ventricle: The right ventricular size is normal. No increase in right ventricular wall thickness. Right ventricular systolic function is normal. There is normal pulmonary artery systolic pressure. The tricuspid regurgitant velocity is 1.20 m/s, and  with an assumed right atrial pressure of 3 mmHg, the estimated right ventricular systolic pressure is 8.8 mmHg. Left Atrium: Left atrial size was mildly dilated. Right Atrium: Right atrial size was normal in size. Pericardium: There is no evidence of pericardial effusion. Mitral Valve: The mitral valve is normal in structure. Trivial mitral valve regurgitation. No evidence of mitral valve stenosis. Tricuspid Valve: The tricuspid valve is normal in structure. Tricuspid valve regurgitation is trivial. No evidence of tricuspid stenosis. Aortic Valve: The aortic valve is tricuspid. Aortic valve regurgitation is not visualized. No aortic stenosis is present. Pulmonic Valve: The pulmonic valve was normal in structure. Pulmonic valve regurgitation is not visualized. No evidence of pulmonic stenosis. Aorta: The aortic root  is normal in size and structure. Venous: The inferior vena cava is normal in size with greater than 50% respiratory variability, suggesting right atrial pressure of 3 mmHg. IAS/Shunts: No atrial level shunt detected by color flow Doppler.  LEFT VENTRICLE PLAX 2D  Biplane EF (MOD) LVIDd:         5.10 cm         LV Biplane EF:   Left LVIDs:         2.90 cm                          ventricular LV PW:         0.90 cm                          ejection LV IVS:        0.90 cm                          fraction by LVOT diam:     2.10 cm                          2D MOD LV SV:         69                               biplane is LV SV Index:   37                               64.5 %. LVOT Area:     3.46 cm                                Diastology                                LV e' medial:    6.85 cm/s LV Volumes (MOD)               LV E/e' medial:  10.4 LV vol d, MOD    93.2 ml       LV e' lateral:   11.30 cm/s A2C:                           LV E/e' lateral: 6.3 LV vol d, MOD    76.2 ml A4C: LV vol s, MOD    34.0 ml A2C: LV vol s, MOD    25.1 ml A4C: LV SV MOD A2C:   59.2 ml LV SV MOD A4C:   76.2 ml LV SV MOD BP:    54.9 ml RIGHT VENTRICLE RV S prime:     10.20 cm/s TAPSE (M-mode): 2.7 cm LEFT ATRIUM             Index       RIGHT ATRIUM           Index LA diam:        3.70 cm 1.97 cm/m  RA Area:     14.10 cm LA Vol (A2C):   48.2 ml 25.61 ml/m RA Volume:   34.00 ml  18.06 ml/m LA Vol (A4C):   18.4 ml 9.78 ml/m LA Biplane Vol: 32.7 ml 17.37 ml/m  AORTIC VALVE LVOT Vmax:   75.10 cm/s LVOT Vmean:  54.000 cm/s LVOT VTI:    0.199 m  AORTA Ao Root diam: 2.80 cm MITRAL  VALVE               TRICUSPID VALVE MV Area (PHT): 2.91 cm    TR Peak grad:   5.8 mmHg MV Decel Time: 261 msec    TR Vmax:        120.00 cm/s MV E velocity: 71.10 cm/s MV A velocity: 62.10 cm/s  SHUNTS MV E/A ratio:  1.14        Systemic VTI:  0.20 m                            Systemic Diam: 2.10 cm Chilton Si MD Electronically  signed by Chilton Si MD Signature Date/Time: 10/20/2019/2:31:34 PM    Final     Cardiac Studies   Echo: 10/20/19  IMPRESSIONS    1. Left ventricular ejection fraction, by estimation, is 60 to 65%. Left  ventricular ejection fraction by 2D MOD biplane is 64.5 %. The left  ventricle has normal function. The left ventricle has no regional wall  motion abnormalities. Left ventricular  diastolic parameters were normal.  2. Right ventricular systolic function is normal. The right ventricular  size is normal. There is normal pulmonary artery systolic pressure.  3. Left atrial size was mildly dilated.  4. The mitral valve is normal in structure. Trivial mitral valve  regurgitation. No evidence of mitral stenosis.  5. The aortic valve is tricuspid. Aortic valve regurgitation is not  visualized. No aortic stenosis is present.  6. The inferior vena cava is normal in size with greater than 50%  respiratory variability, suggesting right atrial pressure of 3 mmHg.   Patient Profile     49 y.o. female with no significant PMH who presented to the ED with chest tightness and palpitations and found to be in SVT along with elevated troponin.   Assessment & Plan    1. SVT: no further episodes noted on telemetry. Baseline HR in the 40-50s, though in the 90s with pre meds for contrast. Will hold on adding AVN blockers for now  2. Elevated troponin: peaked at 312, suspect demand ischemia in the setting of SVT. Planned for coronary CT today. No episodes of chest pain. Echo with normal EF and rWMA noted.   For questions or updates, please contact [facility] Please consult [email or web address] for contact info under        Signed, Laverda Page, NP  10/21/2019, 10:27 AM

## 2019-10-21 NOTE — Progress Notes (Signed)
Nutrition Brief Note  RD consulted for diet education. Pt unavailable during attempted time of contact. Handout "Heart Healthy Nutrition Therapy" from the Academy of Nutrition and Dietetics Manual placed in pt discharge instructions. RD contact information provided. RD to follow up as able with diet education.   Roslyn Smiling, MS, RD, LDN Pager # [phone] After hours/ weekend pager # [phone]

## 2019-10-21 NOTE — Discharge Summary (Signed)
Physician Discharge Summary  Joyce EatonSayyidah Peters ZOX:[id number] DOB: 12/11/1970 DOA: 10/19/2019  PCP: Patient, No Pcp Per  Admit date: 10/19/2019 Discharge date: 10/21/2019  Admitted From: home Discharge disposition: home   Recommendations for Outpatient Follow-Up:   1. Would consider starting low-dose ARB (losartan 25 mg daily) starting tomorrow to allow for contrast clearance 2. Cards: discuss options of vagal maneuvers and possible pill in the pocket techniques etc to treat her SVT.   Discharge Diagnosis:   Principal Problem:   Chest pain Active Problems:   SVT (supraventricular tachycardia) (HCC)   Elevated troponin    Discharge Condition: Improved.  Diet recommendation: Low sodium, heart healthy.  Carbohydrate-modified.  Wound care: None.  Code status: Full.   History of Present Illness:   Joyce EatonSayyidah Joyce Peters is a 49 y.o.femalewithhistory of tobacco abuse started experiencing chest pressure with palpitation when patient came back home last evening. It increased with exertion no associated shortness of breath or diaphoresis. Since it persisted EMS was called and patient was found to be in SVT. On the route to the ER patient was given 1 dose of adenosine 6 mg IV following which patient converted to sinus rhythm.  In Ed> troponin 71, started on Heparin infusion and cardiology consulted   Hospital Course by Problem:   Chest pain   SVT (supraventricular tachycardia)    Elevated troponin - LDL 178- will need to start a low fat diet- start statin- - 2 D ECHO WNL -CTA WNL -per cards:  Would consider starting low-dose ARB (losartan 25 mg daily) starting tomorrow to allow for contrast clearance. I talked to her about different vagal maneuver techniques to abort episodes. I will be happy to see her in follow-up (we will try to arrange).  We can discuss options of vagal maneuvers and possible pill in the pocket techniques etc to treat her SVT.  Nicotine abuse -  encouraged cessation   HTN - losartan   Medical Consultants:   cards   Discharge Exam:   Vitals:   10/21/19 0027 10/21/19 1213  BP: (!) 152/93 138/78  Pulse: 72 64  Resp: 15 16  Temp: 98 F (36.7 C) 98.5 F (36.9 C)  SpO2: 96% 97%   Vitals:   10/20/19 1709 10/20/19 1724 10/21/19 0027 10/21/19 1213  BP: (!) 151/65  (!) 152/93 138/78  Pulse: 76  72 64  Resp: 17  15 16   Temp: 99 F (37.2 C)  98 F (36.7 C) 98.5 F (36.9 C)  TempSrc: Oral  Oral Oral  SpO2: 98%  96% 97%  Weight: 81.6 kg 98.7 kg    Height:  5\' 7"  (1.702 m)      General exam: Appears calm and comfortable.    The results of significant diagnostics from this hospitalization (including imaging, microbiology, ancillary and laboratory) are listed below for reference.     Procedures and Diagnostic Studies:   DG Chest Port 1 View  Result Date: 10/19/2019 CLINICAL DATA:  Shortness of breath. EXAM: PORTABLE CHEST 1 VIEW COMPARISON:  None. FINDINGS: The heart size and mediastinal contours are within normal limits. Both lungs are clear. The visualized skeletal structures are unremarkable. IMPRESSION: No active disease. Electronically Signed   By: Katherine Mantlehristopher  Green M.D.   On: 10/19/2019 21:31   ECHOCARDIOGRAM COMPLETE  Result Date: 10/20/2019    ECHOCARDIOGRAM REPORT   Patient Name:   Joyce Peters Alire Date of Exam: 10/20/2019 Medical Rec #:  [id number]        Height: Accession #:  [id number]       Weight:       180.0 lb Date of Birth:  Jun 19, 1970         BSA:          1.882 m Patient Age:    49 years         BP:           146/75 mmHg Patient Gender: F                HR:           63 bpm. Exam Location:  Inpatient Procedure: 2D Echo, Cardiac Doppler and Color Doppler Indications:    Elevated Troponin                 Chest Pain 786.50 / R07.9  History:        Patient has no prior history of Echocardiogram examinations.                 Risk Factors:Current Smoker.  Sonographer:    Renella Cunas RDCS Referring Phys:  [id number] CALLIE E GOODRICH IMPRESSIONS  1. Left ventricular ejection fraction, by estimation, is 60 to 65%. Left ventricular ejection fraction by 2D MOD biplane is 64.5 %. The left ventricle has normal function. The left ventricle has no regional wall motion abnormalities. Left ventricular diastolic parameters were normal.  2. Right ventricular systolic function is normal. The right ventricular size is normal. There is normal pulmonary artery systolic pressure.  3. Left atrial size was mildly dilated.  4. The mitral valve is normal in structure. Trivial mitral valve regurgitation. No evidence of mitral stenosis.  5. The aortic valve is tricuspid. Aortic valve regurgitation is not visualized. No aortic stenosis is present.  6. The inferior vena cava is normal in size with greater than 50% respiratory variability, suggesting right atrial pressure of 3 mmHg. FINDINGS  Left Ventricle: Left ventricular ejection fraction, by estimation, is 60 to 65%. Left ventricular ejection fraction by 2D MOD biplane is 64.5 %. The left ventricle has normal function. The left ventricle has no regional wall motion abnormalities. The left ventricular internal cavity size was normal in size. There is no left ventricular hypertrophy. Left ventricular diastolic parameters were normal. Indeterminate filling pressures. Right Ventricle: The right ventricular size is normal. No increase in right ventricular wall thickness. Right ventricular systolic function is normal. There is normal pulmonary artery systolic pressure. The tricuspid regurgitant velocity is 1.20 m/s, and  with an assumed right atrial pressure of 3 mmHg, the estimated right ventricular systolic pressure is 8.8 mmHg. Left Atrium: Left atrial size was mildly dilated. Right Atrium: Right atrial size was normal in size. Pericardium: There is no evidence of pericardial effusion. Mitral Valve: The mitral valve is normal in structure. Trivial mitral valve regurgitation. No evidence of  mitral valve stenosis. Tricuspid Valve: The tricuspid valve is normal in structure. Tricuspid valve regurgitation is trivial. No evidence of tricuspid stenosis. Aortic Valve: The aortic valve is tricuspid. Aortic valve regurgitation is not visualized. No aortic stenosis is present. Pulmonic Valve: The pulmonic valve was normal in structure. Pulmonic valve regurgitation is not visualized. No evidence of pulmonic stenosis. Aorta: The aortic root is normal in size and structure. Venous: The inferior vena cava is normal in size with greater than 50% respiratory variability, suggesting right atrial pressure of 3 mmHg. IAS/Shunts: No atrial level shunt detected by color flow Doppler.  LEFT VENTRICLE PLAX 2D  Biplane EF (MOD) LVIDd:         5.10 cm         LV Biplane EF:   Left LVIDs:         2.90 cm                          ventricular LV PW:         0.90 cm                          ejection LV IVS:        0.90 cm                          fraction by LVOT diam:     2.10 cm                          2D MOD LV SV:         69                               biplane is LV SV Index:   37                               64.5 %. LVOT Area:     3.46 cm                                Diastology                                LV e' medial:    6.85 cm/s LV Volumes (MOD)               LV E/e' medial:  10.4 LV vol d, MOD    93.2 ml       LV e' lateral:   11.30 cm/s A2C:                           LV E/e' lateral: 6.3 LV vol d, MOD    76.2 ml A4C: LV vol s, MOD    34.0 ml A2C: LV vol s, MOD    25.1 ml A4C: LV SV MOD A2C:   59.2 ml LV SV MOD A4C:   76.2 ml LV SV MOD BP:    54.9 ml RIGHT VENTRICLE RV S prime:     10.20 cm/s TAPSE (M-mode): 2.7 cm LEFT ATRIUM             Index       RIGHT ATRIUM           Index LA diam:        3.70 cm 1.97 cm/m  RA Area:     14.10 cm LA Vol (A2C):   48.2 ml 25.61 ml/m RA Volume:   34.00 ml  18.06 ml/m LA Vol (A4C):   18.4 ml 9.78 ml/m LA Biplane Vol: 32.7 ml 17.37 ml/m  AORTIC VALVE  LVOT Vmax:   75.10 cm/s LVOT Vmean:  54.000 cm/s LVOT VTI:    0.199 m  AORTA Ao Root diam: 2.80 cm  MITRAL VALVE               TRICUSPID VALVE MV Area (PHT): 2.91 cm    TR Peak grad:   5.8 mmHg MV Decel Time: 261 msec    TR Vmax:        120.00 cm/s MV E velocity: 71.10 cm/s MV A velocity: 62.10 cm/s  SHUNTS MV E/A ratio:  1.14        Systemic VTI:  0.20 m                            Systemic Diam: 2.10 cm Chilton Si MD Electronically signed by Chilton Si MD Signature Date/Time: 10/20/2019/2:31:34 PM    Final      Labs:   Basic Metabolic Panel: Recent Labs  Lab 10/19/19 2126 10/20/19 0512 10/20/19 1639  NA 142 140  --   K 2.8* 4.1  --   CL 109 106  --   CO2 23 23  --   GLUCOSE 99 97  --   BUN 8 8  --   CREATININE 0.84 0.70  --   CALCIUM 8.1* 9.1  --   MG 1.5*  --  1.9   GFR Estimated Creatinine Clearance: 102.6 mL/min (by C-G formula based on SCr of 0.7 mg/dL). Liver Function Tests: Recent Labs  Lab 10/19/19 2126  AST 19  ALT 19  ALKPHOS 50  BILITOT 0.5  PROT 5.9*  ALBUMIN 3.4*   No results for input(s): LIPASE, AMYLASE in the last 168 hours. No results for input(s): AMMONIA in the last 168 hours. Coagulation profile No results for input(s): INR, PROTIME in the last 168 hours.  CBC: Recent Labs  Lab 10/19/19 2126 10/20/19 0512  WBC 6.6 6.7  HGB 11.5* 12.2  HCT 36.4 38.1  MCV 86.7 87.4  PLT 298 315   Cardiac Enzymes: No results for input(s): CKTOTAL, CKMB, CKMBINDEX, TROPONINI in the last 168 hours. BNP: Invalid input(s): POCBNP CBG: No results for input(s): GLUCAP in the last 168 hours. D-Dimer Recent Labs    10/19/19 2259  DDIMER 0.45   Hgb A1c No results for input(s): HGBA1C in the last 72 hours. Lipid Profile Recent Labs    10/20/19 0512  CHOL 237*  HDL 44  LDLCALC 178*  TRIG 73  CHOLHDL 5.4   Thyroid function studies Recent Labs    10/20/19 0512  TSH 0.884   Anemia work up No results for input(s): VITAMINB12, FOLATE,  FERRITIN, TIBC, IRON, RETICCTPCT in the last 72 hours. Microbiology Recent Results (from the past 240 hour(s))  Respiratory Panel by RT PCR (Flu A&B, Covid) - Nasopharyngeal Swab     Status: None   Collection Time: 10/20/19  2:18 AM   Specimen: Nasopharyngeal Swab  Result Value Ref Range Status   SARS Coronavirus 2 by RT PCR NEGATIVE NEGATIVE Final    Comment: (NOTE) SARS-CoV-2 target nucleic acids are NOT DETECTED.  The SARS-CoV-2 RNA is generally detectable in upper respiratoy specimens during the acute phase of infection. The lowest concentration of SARS-CoV-2 viral copies this assay can detect is 131 copies/mL. A negative result does not preclude SARS-Cov-2 infection and should not be used as the sole basis for treatment or other patient management decisions. A negative result may occur with  improper specimen collection/handling, submission of specimen other than nasopharyngeal swab, presence of viral mutation(s) within the areas targeted by this assay, and inadequate number of viral copies (<131 copies/mL). A  negative result must be combined with clinical observations, patient history, and epidemiological information. The expected result is Negative.  Fact Sheet for Patients:  https://www.moore.com/  Fact Sheet for Healthcare Providers:  https://www.young.biz/  This test is no t yet approved or cleared by the [place] FDA and  has been authorized for detection and/or diagnosis of SARS-CoV-2 by FDA under an Emergency Use Authorization (EUA). This EUA will remain  in effect (meaning this test can be used) for the duration of the COVID-19 declaration under Section 564(b)(1) of the Act, 21 U.S.C. section 360bbb-3(b)(1), unless the authorization is terminated or revoked sooner.     Influenza A by PCR NEGATIVE NEGATIVE Final   Influenza B by PCR NEGATIVE NEGATIVE Final    Comment: (NOTE) The Xpert Xpress SARS-CoV-2/FLU/RSV assay is  intended as an aid in  the diagnosis of influenza from Nasopharyngeal swab specimens and  should not be used as a sole basis for treatment. Nasal washings and  aspirates are unacceptable for Xpert Xpress SARS-CoV-2/FLU/RSV  testing.  Fact Sheet for Patients: https://www.moore.com/  Fact Sheet for Healthcare Providers: https://www.young.biz/  This test is not yet approved or cleared by the [place] FDA and  has been authorized for detection and/or diagnosis of SARS-CoV-2 by  FDA under an Emergency Use Authorization (EUA). This EUA will remain  in effect (meaning this test can be used) for the duration of the  Covid-19 declaration under Section 564(b)(1) of the Act, 21  U.S.C. section 360bbb-3(b)(1), unless the authorization is  terminated or revoked. Performed at [facility], [address], [place], [place] [address]      Discharge Instructions:   Discharge Instructions    Diet - low sodium heart healthy   Complete by: As directed    Increase activity slowly   Complete by: As directed      Allergies as of 10/21/2019      Reactions   Contrast Media [iodinated Diagnostic Agents]       Medication List    TAKE these medications   acetaminophen 500 MG tablet Commonly known as: TYLENOL Take 500 mg by mouth every 6 (six) hours as needed for mild pain.   atorvastatin 20 MG tablet Commonly known as: LIPITOR Take 1 tablet (20 mg total) by mouth daily. Start taking on: October 22, 2019   losartan 25 MG tablet Commonly known as: Cozaar Take 1 tablet (25 mg total) by mouth daily. Start taking on: October 22, 2019         Time coordinating discharge:25 min  Signed:  Joseph Art DO  [facility] 10/21/2019, 2:44 PM

## 2019-10-21 NOTE — Discharge Instructions (Signed)
Heart Healthy Nutrition Therapy A heart-healthy diet is recommended to reduce your unhealthy blood cholesterol levels, manage high blood pressure, and lower your risk for heart disease. Tips To follow a heart-healthy diet, . Eat a balanced diet with whole grains, fruits and vegetables, and lean protein sources. . Achieve and maintain a healthy weight. . Choose heart-healthy unsaturated fats. Limit saturated fats, trans fats, and cholesterol intake. Eat more plant-based or vegetarian meals using beans and soy foods for protein. . Eat whole, unprocessed foods to limit the amount of sodium (salt) you eat. . Limit refined carbohydrates especially sugar, sweets and sugar-sweetened beverages. . If you drink alcohol, do so in moderation: one serving per day (women) and two servings per day (men).  o One serving is equivalent to 12 ounces beer, 5 ounces wine, or 1.5 ounces distilled spirits Tips for Choosing Heart-Healthy Fats Choose lean protein and low-fat dairy foods to reduce saturated fat intake. . Saturated fat is usually found in animal-based protein and is associated with certain health risks. Saturated fat is the biggest contributor to raised low-density lipoprotein (LDL) cholesterol levels in the diet. Research shows that limiting saturated fat lowers unhealthy cholesterol levels. Eat no more than 7% of your total calories each day from saturated fat. Ask your RDN to help you determine how much saturated fat is right for you. . There are many foods that do not contain large amounts of saturated fats. Swapping these foods to replace foods high in saturated fats will help you limit the saturated fat you eat and improve your cholesterol levels. You can also try eating more plant-based or vegetarian meals. Instead of. Try:  Whole milk, cheese, yogurt, and ice cream 1%, %, or skim milk, low-fat cheese, non-fat yogurt, and low-fat ice cream  Fatty, marbled beef and pork Lean beef, pork, or venison    Poultry with skin Poultry without skin  Butter, stick margarine Reduced-fat, whipped, or liquid spreads  Coconut oil, palm oil Liquid vegetable oils: corn, canola, olive, soybean and safflower oils   Avoid trans fats. . Trans fats increase levels of LDL-cholesterol. Hydrogenated fat in processed foods is the main source of trans fats in foods.  . Trans fats can be found in stick margarine, shortening, processed sweets, baked goods, some fried foods, and packaged foods made with hydrogenated oils. Avoid foods with "partially hydrogenated oil" on the ingredient list such as: cookies, pastries, baked goods, biscuits, crackers, microwave popcorn, and frozen dinners.   Choose foods with heart healthy fats. . Polyunsaturated and monounsaturated fat are unsaturated fats that may help lower your blood cholesterol level when used in place of saturated fat in your diet. . Ask your RDN about taking a dietary supplement with plant sterols and stanols to help lower your cholesterol level. Marland Kitchen Research shows that substituting saturated fats with unsaturated fats is beneficial to cholesterol levels. Try these easy swaps:   Instead of. Try:  Butter, stick margarine, or solid shortening Reduced-fat, whipped, or liquid spreads  Beef, pork, or poultry with skin   Fish and seafood  Chips, crackers, snack foods Raw or unsalted nuts and seeds or nut butters Hummus with vegetables Avocado on toast  Coconut oil, palm oil Liquid vegetable oils: corn, canola, olive, soybean and safflower oils    Limit the amount of cholesterol you eat to less than 200 milligrams per day. . Cholesterol is a substance carried through the bloodstream via lipoproteins, which are known as "transporters" of fat. Some body functions need cholesterol to work  properly, but too much cholesterol in the bloodstream can damage arteries and build up blood vessel linings (which can lead to heart attack and stroke). You should eat less than 200  milligrams cholesterol per day. Marland Kitchen People respond differently to eating cholesterol. There is no test available right now that can figure out which people will respond more to dietary cholesterol and which will respond less. For individuals with high intake of dietary cholesterol, different types of increase (none, small, moderate, large) in LDL-cholesterol levels are all possible.   . Food sources of cholesterol include egg yolks and organ meats such as liver, gizzards.  Limit egg yolks to two to four per week and avoid organ meats like liver and gizzards to control cholesterol intake.   Tips for Choosing Heart-Healthy Carbohydrates Consume foods rich in viscous (soluble) fiber . Viscous, or soluble, fiber is found in the walls of plant cells. Viscous fiber is found only in plant-based foods--animal-based foods like meat or dairy products do not contain fiber. In the stomach, viscous fibers absorb water and swell to form a thick, jelly-like mass. This helps to lower your unhealthy cholesterol o Rich sources of viscous fiber include asparagus, Brussels sprouts, sweet potatoes, turnips, apricots, mangoes, oranges, legumes, barley, oats, and oat bran.  . Eat at least 5 to 10 grams of viscous fiber each day. As you increase your fiber intake gradually, also increase the amount of water you drink. This will help prevent constipation. . If you have difficulty achieving this goal, ask your RDN about fiber laxatives. Choose fiber supplements made with viscous fibers such as psyllium seed husks or methylcellulose to help lower unhealthy cholesterol.  Limit refined carbohydrates  . There are three types of carbohydrates: starches, sugar, and fiber. Some carbohydrates occur naturally in food, like the starches in rice or corn or the sugars in fruits and milk. Refined carbohydrates--foods with high amounts of simple sugars--can raise triglyceride levels. High triglyceride levels are associated with coronary heart  disease. . Some examples of refined carbohydrate foods are table sugar, sweets, and beverages sweetened with added sugar.  Additional Lifestyle Tips Achieve and maintain a healthy weight. . Talk with your RDN or your doctor about what is a healthy weight for you. . Set goals to reach and maintain that weight.  . To lose weight, reduce your calorie intake along with increasing your physical activity. A weight loss of 10 to 15 pounds could reduce LDL-cholesterol by 5 milligrams per deciliter. Participate in physical activity. . Talk with your health care team to find out what types of physical activity are best for you. Set a plan to get about 30 minutes of exercise on most days.   Foods Recommended Food Group Foods Recommended  Grains Whole grain breads and cereals, including whole wheat, barley, rye, buckwheat, corn, teff, quinoa, millet, amaranth, brown or wild rice, sorghum, and oats Pasta, especially whole wheat or other whole grain types  [employer], quinoa or wild rice Whole grain crackers, bread, rolls, pitas Home-made bread with reduced-sodium baking soda  Protein Foods Lean cuts of beef and pork (loin, leg, round, extra lean hamburger)  Skinless [employer] photographer and other wild game Dried beans and peas Nuts and nut butters (unsalted) Meat alternatives made with soy or textured vegetable protein  Egg whites or egg substitute Cold cuts made with lean meat or soy protein  Dairy Nonfat (skim), low-fat, or 1%-fat milk  Nonfat or low-fat yogurt or cottage cheese Fat-free and low-fat cheese  Vegetables  Fresh, frozen, or canned vegetables without added fat or salt   Fruits Fresh, frozen, canned, or dried fruit   Oils Unsaturated oils (corn, olive, peanut, soy, sunflower, canola)  Soft or liquid margarines and vegetable oil spreads  Salad dressings made from unsaturated fats Seeds and nuts Avocado   Foods Not Recommended Food Group Foods Not Recommended  Grains Breads or  crackers topped with salt Cereals (hot or cold) with more than 300 mg sodium per serving Biscuits, cornbread, and other "quick" breads prepared with baking soda Bread crumbs or stuffing mix from a store High-fat bakery products, such as doughnuts, biscuits, croissants, danish pastries, pies, cookies Instant cooking foods to which you add hot water and stir--potatoes, noodles, rice, etc. Packaged starchy foods--seasoned noodle or rice dishes, stuffing mix, macaroni and cheese dinner Snacks made with partially hydrogenated oils, including chips, cheese puffs, snack mixes, regular crackers, butter-flavored popcorn  Protein Foods Higher-fat cuts of meats (ribs, t-bone steak, regular hamburger) Bacon, sausage, or hot dogs Cold cuts, such as salami or bologna, deli meats, cured meats, corned beef Organ meats (liver, brains, gizzards, sweetbreads) Poultry with skin Fried or smoked meat, poultry, and fish Whole eggs and egg yolks (more than 2-4 per week) Salted legumes, nuts, seeds, or nut/seed butters Meat alternatives with high levels of sodium (>300 mg per serving) or saturated fat (>5 g per serving)  Dairy Whole milk,?2% fat milk, buttermilk Whole milk yogurt or ice cream Cream Half-&-half Cream cheese Sour cream Cheese  Vegetables Canned or frozen vegetables with salt, fresh vegetables prepared with salt, butter, cheese, or cream sauce Fried vegetables Pickled vegetables such as olives, pickles, or sauerkraut  Fruits Fried fruits Fruits served with butter or cream  Oils Butter, stick margarine, shortening Partially hydrogenated oils or trans fats Tropical oils (coconut, palm, palm kernel oils)  Other Candy, sugar sweetened soft drinks and desserts Salt, sea salt, garlic salt, and seasoning mixes containing salt Bouillon cubes Ketchup, barbecue sauce, Worcestershire sauce, soy sauce, teriyaki sauce Miso Salsa Pickles, olives, relish  Heart-Healthy Eating Sample 1-Day Menu View  Nutrient Info  Breakfast 1 cup oatmeal 1 cup fat-free milk 1 cup blueberries 1 cup brewed coffee 1 ounce almonds  Lunch 2 slices whole-wheat bread 2 oz lean deli [place] breast 1 oz low-fat Swiss cheese 2 slices tomato 2 lettuce leaves 1 pear 1 cup skim milk  Afternoon Snack 1 oz trail mix (with nuts, seeds, raisins)  Evening Meal 3 oz broiled salmon 2/3 cup brown rice 1 tsp margarine 1/2 cup cooked broccoli 1/2 cup cooked carrots 1 cup tossed salad 1 teaspoon olive oil and vinegar dressing 1 small whole-wheat roll 1 tsp margarine 1 cup tea  Evening Snack 1 banana   Roslyn Smiling, MS, RD, LDN Clinical Dietitian Office phone [phone]

## 2019-11-19 NOTE — Progress Notes (Deleted)
Cardiology Office Note:    Date:  11/19/2019   ID:  Robyne, Matar 1970/04/28, MRN [id number]  PCP:  Patient, No Pcp Per  Cardiologist:  Bryan Lemma, MD  Electrophysiologist:  None   Referring MD: No ref. provider found   Chief Complaint: hospital follow-up for SVT  History of Present Illness:    Joyce Peters is a 49 y.o. female with a history of normal coronaries and calcium score of 0 on coronary CTA on 10/21/2019, paroxysmal SVT, hypertension, hyperlipidemia, and tobacco abuse who presents today for hospital follow-up of SVT.  Patient was recently admitted from 10/19/2019 to 10/21/2019 for paroxysmal SVT (rates as high as the 190s) with associated chest tightness and shortness of breath. She was treated with 6mg  of Adenosine in the field and converted back to normal sinus rhythm. High-sensitivity troponin peaked in the 300's. Echo showed LVEF of 60-65% with normal wall motion and diastolic parameters. EKGs while in SVT showed mil ST elevation in aVR as well as ST depressions in anterior and inferolateral leads. Given this and elevated high-sensitivity troponin, coronary CTA was ordered which showed coronary calcium score of 0 and normal coronaries. AV nodal blockers were not started given baseline bradycardia. She was started on Losartan for hypertension.  Patient presents today for follow-up. ***  Paroxysmal SVT - Patient was recently admitted for SVT with rates in the 190's. Converted with Adenosine. - No recurrence of palpitations. *** - No AV nodal blockers due to baseline bradycardia.  Hypertension - *** - Continue Losartan 25mg  daily. *** - Will check CMET today. ***  Hyperlipidemia - Lipid panel from 10/20/2019: Total Cholesterol 237, Triglycerides 73, HDL 44, LDL 178.  - Started on Lipitor 20mg  daily during recent admission.  - Will check lipid panel and CMET today. ***  Tobacco Abuse - ***  Past Medical History:  Diagnosis Date  . SVT (supraventricular  tachycardia) (HCC) 10/20/2019    Past Surgical History:  Procedure Laterality Date  . CESAREAN SECTION    . CESAREAN SECTION      Current Medications: No outpatient medications have been marked as taking for the 11/30/19 encounter (Appointment) with , PA-C.     Allergies:   Contrast media [iodinated diagnostic agents]   Social History   Socioeconomic History  . Marital status: Single    Spouse name: Not on file  . Number of children: Not on file  . Years of education: Not on file  . Highest education level: Not on file  Occupational History  . Not on file  Tobacco Use  . Smoking status: Current Every Day Smoker    Packs/day: 0.25    Types: Cigarettes  . Smokeless tobacco: Never Used  Vaping Use  . Vaping Use: Never used  Substance and Sexual Activity  . Alcohol use: Not Currently  . Drug use: Not Currently  . Sexual activity: Not on file  Other Topics Concern  . Not on file  Social History Narrative  . Not on file   Social Determinants of Health   Financial Resource Strain:   . Difficulty of Paying Living Expenses: Not on file  Food Insecurity:   . Worried About 12/20/2019 in the Last Year: Not on file  . Ran Out of Food in the Last Year: Not on file  Transportation Needs:   . Lack of Transportation (Medical): Not on file  . Lack of Transportation (Non-Medical): Not on file  Physical Activity:   . Days of Exercise  per Week: Not on file  . Minutes of Exercise per Session: Not on file  Stress:   . Feeling of Stress : Not on file  Social Connections:   . Frequency of Communication with Friends and Family: Not on file  . Frequency of Social Gatherings with Friends and Family: Not on file  . Attends Religious Services: Not on file  . Active Member of Clubs or Organizations: Not on file  . Attends Banker Meetings: Not on file  . Marital Status: Not on file     Family History: The patient's ***family history includes  CAD in her mother.  ROS:   Please see the history of present illness.    *** All other systems reviewed and are negative.  EKGs/Labs/Other Studies Reviewed:    The following studies were reviewed today:  Echocardiogram 10/20/2019: Impressions: 1. Left ventricular ejection fraction, by estimation, is 60 to 65%. Left  ventricular ejection fraction by 2D MOD biplane is 64.5 %. The left  ventricle has normal function. The left ventricle has no regional wall  motion abnormalities. Left ventricular  diastolic parameters were normal.  2. Right ventricular systolic function is normal. The right ventricular  size is normal. There is normal pulmonary artery systolic pressure.  3. Left atrial size was mildly dilated.  4. The mitral valve is normal in structure. Trivial mitral valve  regurgitation. No evidence of mitral stenosis.  5. The aortic valve is tricuspid. Aortic valve regurgitation is not  visualized. No aortic stenosis is present.  6. The inferior vena cava is normal in size with greater than 50%  respiratory variability, suggesting right atrial pressure of 3 mmHg. _______________  Coronary CTA 10/21/2019: Impressions: 1. Coronary calcium score of 0. This was 0 percentile for age and sex matched control. 2. Normal coronary origin with right dominance. 3. No evidence of CAD.  EKG:  EKG not ordered today.   Recent Labs: 10/19/2019: ALT 19 10/20/2019: BUN 8; Creatinine, Ser 0.70; Hemoglobin 12.2; Magnesium 1.9; Platelets 315; Potassium 4.1; Sodium 140; TSH 0.884  Recent Lipid Panel    Component Value Date/Time   CHOL 237 (H) 10/20/2019 0512   TRIG 73 10/20/2019 0512   HDL 44 10/20/2019 0512   CHOLHDL 5.4 10/20/2019 0512   VLDL 15 10/20/2019 0512   LDLCALC 178 (H) 10/20/2019 0512    Physical Exam:    Vital Signs: There were no vitals taken for this visit.    Wt Readings from Last 3 Encounters:  10/20/19 217 lb 8 oz (98.7 kg)  12/27/18 180 lb (81.6 kg)      General: 49 y.o. female in no acute distress. HEENT: Normocephalic and atraumatic. Sclera clear. EOMs intact. Neck: Supple. No carotid bruits. No JVD. Heart: *** RRR. Distinct S1 and S2. No murmurs, gallops, or rubs. Radial and distal pedal pulses 2+ and equal bilaterally. Lungs: No increased work of breathing. Clear to ausculation bilaterally. No wheezes, rhonchi, or rales.  Abdomen: Soft, non-distended, and non-tender to palpation. Bowel sounds present in all 4 quadrants.  MSK: Normal strength and tone for age. *** Extremities: No lower extremity edema.    Skin: Warm and dry. Neuro: Alert and oriented x3. No focal deficits. Psych: Normal affect. Responds appropriately.   Assessment:    No diagnosis found.  Plan:     Disposition: Follow up in ***   Medication Adjustments/Labs and Tests Ordered: Current medicines are reviewed at length with the patient today.  Concerns regarding medicines are outlined above.  No  orders of the defined types were placed in this encounter.  No orders of the defined types were placed in this encounter.   There are no Patient Instructions on file for this visit.   Signed, Corrin Parker, PA-C  11/19/2019 7:48 PM    [facility]

## 2019-11-30 ENCOUNTER — Ambulatory Visit: Payer: Self-pay | Admitting: Student

## 2019-12-26 ENCOUNTER — Encounter (HOSPITAL_COMMUNITY): Payer: Self-pay

## 2019-12-26 ENCOUNTER — Other Ambulatory Visit: Payer: Self-pay

## 2019-12-26 ENCOUNTER — Emergency Department (HOSPITAL_COMMUNITY)
Admission: EM | Admit: 2019-12-26 | Discharge: 2019-12-26 | Disposition: A | Discharge status: Home / Self Care | Payer: Medicaid Other | Attending: Emergency Medicine | Admitting: Emergency Medicine

## 2019-12-26 DIAGNOSIS — N760 Acute vaginitis: Secondary | ICD-10-CM

## 2019-12-26 DIAGNOSIS — B9689 Other specified bacterial agents as the cause of diseases classified elsewhere: Secondary | ICD-10-CM

## 2019-12-26 DIAGNOSIS — R030 Elevated blood-pressure reading, without diagnosis of hypertension: Secondary | ICD-10-CM | POA: Insufficient documentation

## 2019-12-26 DIAGNOSIS — N898 Other specified noninflammatory disorders of vagina: Secondary | ICD-10-CM | POA: Insufficient documentation

## 2019-12-26 DIAGNOSIS — I10 Essential (primary) hypertension: Secondary | ICD-10-CM

## 2019-12-26 DIAGNOSIS — F17210 Nicotine dependence, cigarettes, uncomplicated: Secondary | ICD-10-CM | POA: Insufficient documentation

## 2019-12-26 DIAGNOSIS — A599 Trichomoniasis, unspecified: Secondary | ICD-10-CM

## 2019-12-26 DIAGNOSIS — F419 Anxiety disorder, unspecified: Secondary | ICD-10-CM

## 2019-12-26 LAB — COMPREHENSIVE METABOLIC PANEL
ALT: 16 U/L (ref 0–44)
AST: 15 U/L (ref 15–41)
Albumin: 4.1 g/dL (ref 3.5–5.0)
Alkaline Phosphatase: 57 U/L (ref 38–126)
Anion gap: 9 (ref 5–15)
BUN: 12 mg/dL (ref 6–20)
CO2: 28 mmol/L (ref 22–32)
Calcium: 9.6 mg/dL (ref 8.9–10.3)
Chloride: 105 mmol/L (ref 98–111)
Creatinine, Ser: 0.89 mg/dL (ref 0.44–1.00)
GFR, Estimated: >60 mL/min (ref >60)
Glucose, Bld: 100 mg/dL — ABNORMAL HIGH (ref 70–99)
Potassium: 3.9 mmol/L (ref 3.5–5.1)
Sodium: 142 mmol/L (ref 135–145)
Total Bilirubin: 0.5 mg/dL (ref 0.3–1.2)
Total Protein: 7.4 g/dL (ref 6.5–8.1)

## 2019-12-26 LAB — URINALYSIS, ROUTINE W REFLEX MICROSCOPIC
Bacteria, UA: NONE SEEN
Bilirubin Urine: NEGATIVE
Glucose, UA: NEGATIVE mg/dL
Ketones, ur: NEGATIVE mg/dL
Nitrite: NEGATIVE
Protein, ur: NEGATIVE mg/dL
Specific Gravity, Urine: 1.024 (ref 1.005–1.030)
pH: 6 (ref 5.0–8.0)

## 2019-12-26 LAB — CBC
HCT: 42.5 % (ref 36.0–46.0)
Hemoglobin: 13.3 g/dL (ref 12.0–15.0)
MCH: 27.5 pg (ref 26.0–34.0)
MCHC: 31.3 g/dL (ref 30.0–36.0)
MCV: 88 fL (ref 80.0–100.0)
Platelets: 303 10*3/uL (ref 150–400)
RBC: 4.83 MIL/uL (ref 3.87–5.11)
RDW: 13.2 % (ref 11.5–15.5)
WBC: 6.3 10*3/uL (ref 4.0–10.5)
nRBC: 0 % (ref 0.0–0.2)

## 2019-12-26 LAB — WET PREP, GENITAL
Sperm: NONE SEEN
Yeast Wet Prep HPF POC: NONE SEEN

## 2019-12-26 LAB — LIPASE, BLOOD: Lipase: 21 U/L (ref 11–51)

## 2019-12-26 LAB — I-STAT BETA HCG BLOOD, ED (MC, WL, AP ONLY): I-stat hCG, quantitative: <5 m[IU]/mL (ref <5)

## 2019-12-26 MED ORDER — FAMOTIDINE 20 MG PO TABS
20.0000 mg | ORAL_TABLET | Freq: Once | ORAL | Status: AC
  Administered 2019-12-26: 17:00:00 20 mg via ORAL
  Filled 2019-12-26: qty 1

## 2019-12-26 MED ORDER — HYDROXYZINE HCL 25 MG PO TABS
50.0000 mg | ORAL_TABLET | Freq: Once | ORAL | Status: AC
  Administered 2019-12-26: 17:00:00 50 mg via ORAL
  Filled 2019-12-26: qty 2

## 2019-12-26 MED ORDER — METRONIDAZOLE 500 MG PO TABS
500.0000 mg | ORAL_TABLET | Freq: Once | ORAL | Status: AC
  Administered 2019-12-26: 18:00:00 500 mg via ORAL
  Filled 2019-12-26: qty 1

## 2019-12-26 MED ORDER — ALUM & MAG HYDROXIDE-SIMETH 200-200-20 MG/5ML PO SUSP
30.0000 mL | Freq: Once | ORAL | Status: AC
  Administered 2019-12-26: 17:00:00 30 mL via ORAL
  Filled 2019-12-26: qty 30

## 2019-12-26 MED ORDER — HYDROXYZINE HCL 25 MG PO TABS: 25.0000 mg | ORAL_TABLET | Freq: Four times a day (QID) | ORAL | 0 refills | Status: DC

## 2019-12-26 MED ORDER — METRONIDAZOLE 500 MG PO TABS: 500.0000 mg | ORAL_TABLET | Freq: Two times a day (BID) | ORAL | 0 refills | Status: DC

## 2019-12-26 MED ORDER — LIDOCAINE VISCOUS HCL 2 % MT SOLN
15.0000 mL | Freq: Once | OROMUCOSAL | Status: AC
  Administered 2019-12-26: 17:00:00 15 mL via ORAL
  Filled 2019-12-26: qty 15

## 2019-12-26 NOTE — ED Provider Notes (Signed)
[facility] EMERGENCY DEPARTMENT Provider Note   CSN: [id number] Arrival date & time: 12/26/19  1225     History Chief Complaint  Patient presents with  . anxiety/ vaginal discharge    Joyce Peters is a 49 y.o. female hx of SVT, here with anxiety, vaginal discharge. Patient states that she is under a lot of stress.  She states that she has been anxious and having panic attacks.  She apparently came here several months ago and was in SVT.  Patient denies any suicidal homicidal ideations.  Patient states that she is also going through menopause.  She states that she has been having vaginal discharge for the last week or so.  She states that she has not been sexually active for the last 2 months.  Denies any pelvic pain.  Patient requesting medicines for anxiety.  Patient also states that she has not been taking her blood pressure medicines because she didn't like how she felt.   The history is provided by the patient.       Past Medical History:  Diagnosis Date  . SVT (supraventricular tachycardia) (HCC) 10/20/2019    Patient Active Problem List   Diagnosis Date Noted  . SVT (supraventricular tachycardia) (HCC) 10/20/2019  . Elevated troponin 10/20/2019  . Chest pain 10/20/2019    Past Surgical History:  Procedure Laterality Date  . CESAREAN SECTION    . CESAREAN SECTION       OB History   No obstetric history on file.     Family History  Problem Relation Age of Onset  . CAD Mother     Social History   Tobacco Use  . Smoking status: Current Every Day Smoker    Packs/day: 0.25    Types: Cigarettes  . Smokeless tobacco: Never Used  Vaping Use  . Vaping Use: Never used  Substance Use Topics  . Alcohol use: Not Currently  . Drug use: Not Currently    Home Medications Prior to Admission medications   Medication Sig Start Date End Date Taking? Authorizing Provider  acetaminophen (TYLENOL) 500 MG tablet Take 500 mg by mouth every 6 (six)  hours as needed for mild pain.    [provider]  atorvastatin (LIPITOR) 20 MG tablet Take 1 tablet (20 mg total) by mouth daily. 10/22/19   Joseph Art, DO  losartan (COZAAR) 25 MG tablet Take 1 tablet (25 mg total) by mouth daily. 10/22/19 10/21/20  Joseph Art, DO    Allergies    Contrast media [iodinated diagnostic agents]  Review of Systems   Review of Systems  Psychiatric/Behavioral: The patient is nervous/anxious.   All other systems reviewed and are negative.   Physical Exam Updated Vital Signs BP (!) 165/106 (BP Location: Right Arm)   Pulse 66   Temp 98.4 F (36.9 C) (Oral)   Resp 16   SpO2 100%   Physical Exam Vitals and nursing note reviewed.  Constitutional:      Appearance: Normal appearance.     Comments: Anxious   HENT:     Head: Normocephalic.     Nose: Nose normal.     Mouth/Throat:     Mouth: Mucous membranes are moist.  Eyes:     Extraocular Movements: Extraocular movements intact.     Pupils: Pupils are equal, round, and reactive to light.  Cardiovascular:     Rate and Rhythm: Normal rate and regular rhythm.     Pulses: Normal pulses.     Heart  sounds: Normal heart sounds.  Pulmonary:     Effort: Pulmonary effort is normal.     Breath sounds: Normal breath sounds.  Abdominal:     General: Abdomen is flat.     Palpations: Abdomen is soft.  Musculoskeletal:        General: Normal range of motion.     Cervical back: Normal range of motion and neck supple.  Skin:    General: Skin is warm.     Capillary Refill: Capillary refill takes less than 2 seconds.  Neurological:     General: No focal deficit present.     Mental Status: She is alert and oriented to person, place, and time.  Psychiatric:        Mood and Affect: Mood normal.        Behavior: Behavior normal.     ED Results / Procedures / Treatments   Labs (all labs ordered are listed, but only abnormal results are displayed) Labs Reviewed  COMPREHENSIVE METABOLIC PANEL  - Abnormal; Notable for the following components:      Result Value   Glucose, Bld 100 (*)    All other components within normal limits  URINALYSIS, ROUTINE W REFLEX MICROSCOPIC - Abnormal; Notable for the following components:   APPearance HAZY (*)    Hgb urine dipstick SMALL (*)    Leukocytes,Ua LARGE (*)    All other components within normal limits  WET PREP, GENITAL  LIPASE, BLOOD  CBC  I-STAT BETA HCG BLOOD, ED ([facility], [facility], [facility] ONLY)  GC/CHLAMYDIA PROBE AMP ([place]) NOT AT [facility]    EKG EKG Interpretation  Date/Time:  Saturday December 26 2019 14:49:08 EST Ventricular Rate:  75 PR Interval:  170 QRS Duration: 74 QT Interval:  392 QTC Calculation: 437 R Axis:   54 Text Interpretation: Normal sinus rhythm Normal ECG No significant change since last tracing Confirmed by Richardean Canal [phone]) on 12/26/2019 4:51:31 PM   Radiology No results found.  Procedures Procedures (including critical care time)  Medications Ordered in ED Medications  hydrOXYzine (ATARAX/VISTARIL) tablet 50 mg (has no administration in time range)  alum & mag hydroxide-simeth (MAALOX/MYLANTA) 200-200-20 MG/5ML suspension 30 mL (has no administration in time range)    And  lidocaine (XYLOCAINE) 2 % viscous mouth solution 15 mL (has no administration in time range)  famotidine (PEPCID) tablet 20 mg (has no administration in time range)    ED Course  I have reviewed the triage vital signs and the nursing notes.  Pertinent labs & imaging results that were available during my care of the patient were reviewed by me and considered in my medical decision making (see chart for details).    MDM Rules/Calculators/A&P                         Joyce Peters is a 49 y.o. female here with anxiety.  Patient has been having panic attacks.  Patient does have some vaginal discharge but is not sexually active and is not concerned for STDs.  Consider BV or yeast infection.  Will have patient self swab and will  obtain CBC and BMP and urinalysis.  5:44 PM Labs are unremarkable.  Patient's wet prep is positive for clue cells and trichomonas.  Patient is adamant that she is not sexually active and is not concerned for STDs.  Will discharge home with Flagyl and Atarax.  Of note, her blood pressure is elevated but she has not been taking her  losartan so I recommend that she takes it as prescribed   Final Clinical Impression(s) / ED Diagnoses Final diagnoses:  None    Rx / DC Orders ED Discharge Orders    None       Charlynne Pander, MD 12/26/19 1744

## 2019-12-26 NOTE — Discharge Instructions (Signed)
Take flagyl twice daily for a week   Take your blood pressure medicines as prescribed   See your primary care doctor in a week to recheck blood pressure   Take atarax for anxiety. See [facility] for counseling for anxiety   Return to ER if you have worse anxiety, thoughts or harming yourself or others, hallucinations

## 2019-12-26 NOTE — ED Triage Notes (Signed)
Patient complains of anxiety being bad the past 2 days with increased crying and feeling anxious. Patient also concerned with vaginal discharge. denies urinary symptoms. Denies SI/ HI thoughts. Alert and oriented

## 2019-12-28 LAB — GC/CHLAMYDIA PROBE AMP (~~LOC~~) NOT AT ARMC
Chlamydia: NEGATIVE
Comment: NEGATIVE
Comment: NORMAL
Neisseria Gonorrhea: NEGATIVE

## 2020-01-25 ENCOUNTER — Emergency Department (HOSPITAL_COMMUNITY)
Admission: EM | Admit: 2020-01-25 | Discharge: 2020-01-26 | Disposition: A | Discharge status: Home / Self Care | Payer: Self-pay | Attending: Emergency Medicine | Admitting: Emergency Medicine

## 2020-01-25 ENCOUNTER — Encounter (HOSPITAL_COMMUNITY): Payer: Self-pay | Admitting: Emergency Medicine

## 2020-01-25 DIAGNOSIS — F17210 Nicotine dependence, cigarettes, uncomplicated: Secondary | ICD-10-CM | POA: Insufficient documentation

## 2020-01-25 DIAGNOSIS — R079 Chest pain, unspecified: Secondary | ICD-10-CM | POA: Insufficient documentation

## 2020-01-25 DIAGNOSIS — R002 Palpitations: Secondary | ICD-10-CM | POA: Insufficient documentation

## 2020-01-25 DIAGNOSIS — R0602 Shortness of breath: Secondary | ICD-10-CM | POA: Insufficient documentation

## 2020-01-25 LAB — I-STAT BETA HCG BLOOD, ED (MC, WL, AP ONLY): I-stat hCG, quantitative: 11.5 m[IU]/mL — ABNORMAL HIGH (ref <5)

## 2020-01-25 LAB — BASIC METABOLIC PANEL
Anion gap: 12 (ref 5–15)
BUN: 12 mg/dL (ref 6–20)
CO2: 24 mmol/L (ref 22–32)
Calcium: 9.8 mg/dL (ref 8.9–10.3)
Chloride: 102 mmol/L (ref 98–111)
Creatinine, Ser: 0.74 mg/dL (ref 0.44–1.00)
GFR, Estimated: >60 mL/min (ref >60)
Glucose, Bld: 96 mg/dL (ref 70–99)
Potassium: 3.9 mmol/L (ref 3.5–5.1)
Sodium: 138 mmol/L (ref 135–145)

## 2020-01-25 LAB — CBC
HCT: 41.7 % (ref 36.0–46.0)
Hemoglobin: 12.9 g/dL (ref 12.0–15.0)
MCH: 27.2 pg (ref 26.0–34.0)
MCHC: 30.9 g/dL (ref 30.0–36.0)
MCV: 88 fL (ref 80.0–100.0)
Platelets: 296 10*3/uL (ref 150–400)
RBC: 4.74 MIL/uL (ref 3.87–5.11)
RDW: 12.5 % (ref 11.5–15.5)
WBC: 7 10*3/uL (ref 4.0–10.5)
nRBC: 0 % (ref 0.0–0.2)

## 2020-01-25 LAB — PREGNANCY, URINE: Preg Test, Ur: NEGATIVE

## 2020-01-25 LAB — TROPONIN I (HIGH SENSITIVITY)
Troponin I (High Sensitivity): 4 ng/L (ref <18)
Troponin I (High Sensitivity): 5 ng/L (ref <18)

## 2020-01-25 MED ORDER — ACETAMINOPHEN 325 MG PO TABS
650.0000 mg | ORAL_TABLET | Freq: Once | ORAL | Status: AC
  Administered 2020-01-25: 650 mg via ORAL
  Filled 2020-01-25: qty 2

## 2020-01-25 NOTE — ED Triage Notes (Signed)
Pt here with c/o chest pain and some slight sob along with nausea , started around 11 am, hx of svt , NSR today

## 2020-01-26 ENCOUNTER — Emergency Department (HOSPITAL_COMMUNITY): Payer: Self-pay

## 2020-01-26 ENCOUNTER — Other Ambulatory Visit (HOSPITAL_COMMUNITY): Payer: Self-pay | Admitting: Emergency Medicine

## 2020-01-26 MED ORDER — ATORVASTATIN CALCIUM 20 MG PO TABS: 20.0000 mg | ORAL_TABLET | Freq: Every day | ORAL | 0 refills | Status: DC

## 2020-01-26 MED ORDER — ALUM & MAG HYDROXIDE-SIMETH 200-200-20 MG/5ML PO SUSP
30.0000 mL | Freq: Once | ORAL | Status: AC
  Administered 2020-01-26: 30 mL via ORAL
  Filled 2020-01-26: qty 30

## 2020-01-26 MED ORDER — HYDROXYZINE HCL 25 MG PO TABS: 25.0000 mg | ORAL_TABLET | Freq: Three times a day (TID) | ORAL | 0 refills | Status: DC | PRN

## 2020-01-26 MED ORDER — LOSARTAN POTASSIUM 25 MG PO TABS: 25.0000 mg | ORAL_TABLET | Freq: Every day | ORAL | 0 refills | Status: DC

## 2020-01-26 MED ORDER — HYDROXYZINE HCL 25 MG PO TABS
25.0000 mg | ORAL_TABLET | Freq: Once | ORAL | Status: AC
  Administered 2020-01-26: 25 mg via ORAL
  Filled 2020-01-26: qty 1

## 2020-01-26 NOTE — Discharge Instructions (Signed)
Follow-up with your primary care doctor and cardiology regarding your episode.  Return to ER if you have recurrent palpitations or chest pain or other new concerning symptom.

## 2020-01-26 NOTE — ED Provider Notes (Signed)
[facility] EMERGENCY DEPARTMENT Provider Note   CSN: [id number] Arrival date & time: 01/25/20  1307     History No chief complaint on file.   Joyce Peters is a 50 y.o. female.  Presented to ER with concern for chest pain, palpitations, shortness of breath.  Patient reports yesterday around 65 AM she had an episode where her heart felt like it was racing and she had associated chest tightness and subsequently developed some associated shortness of breath.  She tried some deep breathing exercise and the symptoms resolved.  Has had intermittent mild chest discomfort ever since.  Slight burning sensation.  No active pain.  Symptoms were similar but much less severe when compared to her episode of SVT when she was admitted on 10/5.  Per review of chart, patient had episode of SVT, and an elevated troponin and was admitted.  Troponins down trended, thought related to SVT and less likely CAD.  Echocardiogram grossly normal.  HPI     Past Medical History:  Diagnosis Date  . SVT (supraventricular tachycardia) (HCC) 10/20/2019    Patient Active Problem List   Diagnosis Date Noted  . SVT (supraventricular tachycardia) (HCC) 10/20/2019  . Elevated troponin 10/20/2019  . Chest pain 10/20/2019    Past Surgical History:  Procedure Laterality Date  . CESAREAN SECTION    . CESAREAN SECTION       OB History   No obstetric history on file.     Family History  Problem Relation Age of Onset  . CAD Mother     Social History   Tobacco Use  . Smoking status: Current Every Day Smoker    Packs/day: 0.25    Types: Cigarettes  . Smokeless tobacco: Never Used  Vaping Use  . Vaping Use: Never used  Substance Use Topics  . Alcohol use: Not Currently  . Drug use: Not Currently    Home Medications Prior to Admission medications   Medication Sig Start Date End Date Taking? Authorizing Provider  acetaminophen (TYLENOL) 500 MG tablet Take 500 mg by mouth every 6  (six) hours as needed for mild pain.   Yes [provider]  atorvastatin (LIPITOR) 20 MG tablet Take 1 tablet (20 mg total) by mouth daily. 10/22/19  Yes Vann, Jessica U, DO  hydrOXYzine (ATARAX/VISTARIL) 25 MG tablet Take 1 tablet (25 mg total) by mouth every 6 (six) hours. 12/26/19  Yes Charlynne Pander, MD  losartan (COZAAR) 25 MG tablet Take 1 tablet (25 mg total) by mouth daily. 10/22/19 10/21/20 Yes Vann, Jessica U, DO  metroNIDAZOLE (FLAGYL) 500 MG tablet Take 1 tablet (500 mg total) by mouth 2 (two) times daily. One po bid x 7 days Patient not taking: Reported on 01/26/2020 12/26/19   Charlynne Pander, MD    Allergies    Shellfish allergy and Contrast media [iodinated diagnostic agents]  Review of Systems   Review of Systems  Constitutional: Negative for chills and fever.  HENT: Negative for ear pain and sore throat.   Eyes: Negative for pain and visual disturbance.  Respiratory: Positive for chest tightness and shortness of breath. Negative for cough.   Cardiovascular: Positive for chest pain and palpitations.  Gastrointestinal: Negative for abdominal pain and vomiting.  Genitourinary: Negative for dysuria and hematuria.  Musculoskeletal: Negative for arthralgias and back pain.  Skin: Negative for color change and rash.  Neurological: Negative for seizures and syncope.  All other systems reviewed and are negative.   Physical Exam Updated Vital  Signs BP (!) 143/82 (BP Location: Left Arm)   Pulse 62   Temp 98.1 F (36.7 C) (Oral)   Resp 16   Wt 97.1 kg   SpO2 99%   BMI 33.52 kg/m   Physical Exam Vitals and nursing note reviewed.  Constitutional:      General: She is not in acute distress.    Appearance: She is well-developed and well-nourished.  HENT:     Head: Normocephalic and atraumatic.  Eyes:     Conjunctiva/sclera: Conjunctivae normal.  Cardiovascular:     Rate and Rhythm: Normal rate and regular rhythm.     Heart sounds: No murmur  heard.   Pulmonary:     Effort: Pulmonary effort is normal. No respiratory distress.     Breath sounds: Normal breath sounds.  Abdominal:     Palpations: Abdomen is soft.     Tenderness: There is no abdominal tenderness.  Musculoskeletal:        General: No edema.     Cervical back: Neck supple.  Skin:    General: Skin is warm and dry.  Neurological:     General: No focal deficit present.     Mental Status: She is alert.  Psychiatric:        Mood and Affect: Mood and affect and mood normal.     ED Results / Procedures / Treatments   Labs (all labs ordered are listed, but only abnormal results are displayed) Labs Reviewed  I-STAT BETA HCG BLOOD, ED ([facility], [facility], [facility] ONLY) - Abnormal; Notable for the following components:      Result Value   I-stat hCG, quantitative 11.5 (*)    All other components within normal limits  BASIC METABOLIC PANEL  CBC  PREGNANCY, URINE  TROPONIN I (HIGH SENSITIVITY)  TROPONIN I (HIGH SENSITIVITY)    EKG EKG Interpretation  Date/Time:  Monday January 25 2020 13:47:57 EST Ventricular Rate:  63 PR Interval:  170 QRS Duration: 76 QT Interval:  432 QTC Calculation: 442 R Axis:   41 Text Interpretation: Normal sinus rhythm Normal ECG When compared with ECG of 12/26/2019, No significant change was found Confirmed by Dione Booze ([id number]) on 01/25/2020 11:44:40 PM   Radiology DG Chest 2 View  Result Date: 01/26/2020 CLINICAL DATA:  Chest pain EXAM: CHEST - 2 VIEW COMPARISON:  October 2021 FINDINGS: The heart size and mediastinal contours are within normal limits. Both lungs are clear. No pleural effusion or pneumothorax. The visualized skeletal structures are unremarkable. IMPRESSION: No acute process in the chest. Electronically Signed   By: Guadlupe Spanish M.D.   On: 01/26/2020 08:22    Procedures Procedures (including critical care time)  Medications Ordered in ED Medications  acetaminophen (TYLENOL) tablet 650 mg (650 mg Oral Given 01/25/20  1808)  alum & mag hydroxide-simeth (MAALOX/MYLANTA) 200-200-20 MG/5ML suspension 30 mL (30 mLs Oral Given 01/26/20 0802)  hydrOXYzine (ATARAX/VISTARIL) tablet 25 mg (25 mg Oral Given 01/26/20 0802)    ED Course  I have reviewed the triage vital signs and the nursing notes.  Pertinent labs & imaging results that were available during my care of the patient were reviewed by me and considered in my medical decision making (see chart for details).    MDM Rules/Calculators/A&P                         50 year old presented to ER with episode of palpitations, chest tightness and shortness of breath.  On physical exam, patient  noted to be remarkably well-appearing in no acute distress.  Her EKG is without acute ischemic change, normal intervals, sinus rhythm.  Troponin x2 is within normal limits.  Doubt ACS.  Electrolytes within normal limits.  CXR negative.  It is possible she had an episode of SVT that responded to vagal maneuvers.  Given reassuring work-up, reassuring exam, believe patient can be discharged and managed in the outpatient setting.  Recommended follow-up with her primary doctor as well as cardiology.    After the discussed management above, the patient was determined to be safe for discharge.  The patient was in agreement with this plan and all questions regarding their care were answered.  ED return precautions were discussed and the patient will return to the ED with any significant worsening of condition.  Final Clinical Impression(s) / ED Diagnoses Final diagnoses:  Chest pain, unspecified type  Palpitations    Rx / DC Orders ED Discharge Orders    None       Milagros Loll, MD 01/26/20 [phone]

## 2020-03-02 ENCOUNTER — Encounter (HOSPITAL_COMMUNITY): Payer: Self-pay | Admitting: Emergency Medicine

## 2020-03-02 ENCOUNTER — Emergency Department (HOSPITAL_COMMUNITY)
Admission: EM | Admit: 2020-03-02 | Discharge: 2020-03-02 | Disposition: A | Discharge status: Home / Self Care | Payer: Medicaid Other | Attending: Emergency Medicine | Admitting: Emergency Medicine

## 2020-03-02 ENCOUNTER — Emergency Department (HOSPITAL_COMMUNITY): Payer: Medicaid Other

## 2020-03-02 DIAGNOSIS — K219 Gastro-esophageal reflux disease without esophagitis: Secondary | ICD-10-CM | POA: Insufficient documentation

## 2020-03-02 DIAGNOSIS — Z79899 Other long term (current) drug therapy: Secondary | ICD-10-CM | POA: Insufficient documentation

## 2020-03-02 DIAGNOSIS — I10 Essential (primary) hypertension: Secondary | ICD-10-CM | POA: Insufficient documentation

## 2020-03-02 DIAGNOSIS — F17210 Nicotine dependence, cigarettes, uncomplicated: Secondary | ICD-10-CM | POA: Insufficient documentation

## 2020-03-02 LAB — CBC
HCT: 39.7 % (ref 36.0–46.0)
Hemoglobin: 12.3 g/dL (ref 12.0–15.0)
MCH: 27.2 pg (ref 26.0–34.0)
MCHC: 31 g/dL (ref 30.0–36.0)
MCV: 87.6 fL (ref 80.0–100.0)
Platelets: 269 10*3/uL (ref 150–400)
RBC: 4.53 MIL/uL (ref 3.87–5.11)
RDW: 13 % (ref 11.5–15.5)
WBC: 5.4 10*3/uL (ref 4.0–10.5)
nRBC: 0 % (ref 0.0–0.2)

## 2020-03-02 LAB — I-STAT BETA HCG BLOOD, ED (MC, WL, AP ONLY): I-stat hCG, quantitative: <5 m[IU]/mL (ref <5)

## 2020-03-02 LAB — BASIC METABOLIC PANEL
Anion gap: 12 (ref 5–15)
BUN: 8 mg/dL (ref 6–20)
CO2: 27 mmol/L (ref 22–32)
Calcium: 9.5 mg/dL (ref 8.9–10.3)
Chloride: 102 mmol/L (ref 98–111)
Creatinine, Ser: 0.75 mg/dL (ref 0.44–1.00)
GFR, Estimated: >60 mL/min (ref >60)
Glucose, Bld: 98 mg/dL (ref 70–99)
Potassium: 3.7 mmol/L (ref 3.5–5.1)
Sodium: 141 mmol/L (ref 135–145)

## 2020-03-02 LAB — TROPONIN I (HIGH SENSITIVITY)
Troponin I (High Sensitivity): 4 ng/L (ref <18)
Troponin I (High Sensitivity): 6 ng/L (ref <18)

## 2020-03-02 MED ORDER — ALUM & MAG HYDROXIDE-SIMETH 200-200-20 MG/5ML PO SUSP
30.0000 mL | Freq: Once | ORAL | Status: AC
  Administered 2020-03-02: 30 mL via ORAL
  Filled 2020-03-02: qty 30

## 2020-03-02 MED ORDER — LOSARTAN POTASSIUM 25 MG PO TABS: 25.0000 mg | ORAL_TABLET | Freq: Every day | ORAL | 0 refills | Status: DC

## 2020-03-02 MED ORDER — ATORVASTATIN CALCIUM 20 MG PO TABS: 20.0000 mg | ORAL_TABLET | Freq: Every day | ORAL | 0 refills | Status: DC

## 2020-03-02 MED ORDER — LIDOCAINE VISCOUS HCL 2 % MT SOLN
15.0000 mL | Freq: Once | OROMUCOSAL | Status: AC
  Administered 2020-03-02: 15 mL via ORAL
  Filled 2020-03-02: qty 15

## 2020-03-02 MED ORDER — HYDROXYZINE HCL 25 MG PO TABS: 25.0000 mg | ORAL_TABLET | Freq: Three times a day (TID) | ORAL | 0 refills | Status: DC | PRN

## 2020-03-02 NOTE — ED Provider Notes (Signed)
[facility] EMERGENCY DEPARTMENT Provider Note   CSN: [id number] Arrival date & time: 03/02/20  1151     History Chief Complaint  Patient presents with  . Chest Pain    Joyce Peters is a 50 y.o. female who presents with 2 days of epigastric and central chest pain that she woke up with.  She describes the pain as a burning sensation, and has been belching more than is normal for her.  She says the pain radiates straight through to her back. She endorses history of GERD, states this pain is similar nature but much more severe. She denies any palpitations, vomiting, diarrhea, fevers, chills at home.  Patient states she has never had anything like this prior.  She states that she feels like it is acid reflux and has been taking Tums and Pepcid for last 2 days without relief.  She states that she has not been eating and drinking due to sensation of nausea and belching, however has not vomited.  I have personally reviewed this patient's medical records.  She has history of hypertension, hyperlipidemia, SVT previously treated with adenosine.  LVEF 60 to 65% on echo in 10/2019; coronary CTA at that time showed calcium score 0.  Patient states that she is noncompliant with her hypertension and hyperlipidemia medications, she does not have a primary care doctor and has run out of both.  She states she does not have medical insurance and has not followed up for refills with anyone; she was prescribed all medications from emergency department providers in the past.  HPI  HPI: A 50 year old patient with a history of hypertension, hypercholesterolemia and obesity presents for evaluation of chest pain. Initial onset of pain was more than 6 hours ago. The patient's chest pain is described as heaviness/pressure/tightness and is not worse with exertion. The patient complains of nausea. The patient's chest pain is middle- or left-sided, is not well-localized, is not sharp and does not  radiate to the arms/jaw/neck. The patient denies diaphoresis. The patient has a family history of coronary artery disease in a first-degree relative with onset less than age 28. The patient has no history of stroke, has no history of peripheral artery disease, has not smoked in the past 90 days and denies any history of treated diabetes.   Past Medical History:  Diagnosis Date  . SVT (supraventricular tachycardia) (HCC) 10/20/2019    Patient Active Problem List   Diagnosis Date Noted  . SVT (supraventricular tachycardia) (HCC) 10/20/2019  . Elevated troponin 10/20/2019  . Chest pain 10/20/2019    Past Surgical History:  Procedure Laterality Date  . CESAREAN SECTION    . CESAREAN SECTION       OB History   No obstetric history on file.     Family History  Problem Relation Age of Onset  . CAD Mother     Social History   Tobacco Use  . Smoking status: Current Every Day Smoker    Packs/day: 0.25    Types: Cigarettes  . Smokeless tobacco: Never Used  Vaping Use  . Vaping Use: Never used  Substance Use Topics  . Alcohol use: Not Currently  . Drug use: Not Currently    Home Medications Prior to Admission medications   Medication Sig Start Date End Date Taking? Authorizing Provider  atorvastatin (LIPITOR) 20 MG tablet Take 1 tablet (20 mg total) by mouth daily. 03/02/20 04/01/20 Yes Unnamed Zeien R, PA-C  hydrOXYzine (ATARAX/VISTARIL) 25 MG tablet Take 1 tablet (25 mg  total) by mouth every 8 (eight) hours as needed. 03/02/20  Yes Walda Hertzog R, PA-C  losartan (COZAAR) 25 MG tablet Take 1 tablet (25 mg total) by mouth daily. 03/02/20 04/01/20 Yes Erby Sanderson, Eugene Gavia, PA-C  acetaminophen (TYLENOL) 500 MG tablet Take 500 mg by mouth every 6 (six) hours as needed for mild pain.    [provider]  metroNIDAZOLE (FLAGYL) 500 MG tablet Take 1 tablet (500 mg total) by mouth 2 (two) times daily. One po bid x 7 days Patient not taking: Reported on 01/26/2020  12/26/19   Charlynne Pander, MD    Allergies    Shellfish allergy and Contrast media [iodinated diagnostic agents]  Review of Systems   Review of Systems  Constitutional: Positive for appetite change.  HENT: Negative.   Respiratory: Negative.  Negative for cough and shortness of breath.   Cardiovascular: Positive for chest pain. Negative for palpitations and leg swelling.  Gastrointestinal: Positive for abdominal pain, constipation and nausea. Negative for diarrhea and vomiting.       Belching  Genitourinary: Negative.   Musculoskeletal: Positive for back pain.       Radiates form chest  Skin: Negative.   Neurological: Negative.     Physical Exam Updated Vital Signs BP (!) 166/93   Pulse 66   Temp 98 F (36.7 C) (Oral)   Resp 15   SpO2 100%   Physical Exam Vitals and nursing note reviewed.  Constitutional:      Appearance: She is obese.  HENT:     Head: Normocephalic and atraumatic.     Nose: Nose normal.     Mouth/Throat:     Mouth: Mucous membranes are moist.     Pharynx: Oropharynx is clear. Uvula midline. No oropharyngeal exudate or posterior oropharyngeal erythema.     Tonsils: No tonsillar exudate.  Eyes:     General: Lids are normal. Vision grossly intact.        Right eye: No discharge.        Left eye: No discharge.     Extraocular Movements: Extraocular movements intact.     Conjunctiva/sclera: Conjunctivae normal.     Pupils: Pupils are equal, round, and reactive to light.  Neck:     Trachea: Trachea and phonation normal.  Cardiovascular:     Rate and Rhythm: Normal rate and regular rhythm.     Pulses: Normal pulses.          Radial pulses are 2+ on the right side and 2+ on the left side.       Dorsalis pedis pulses are 2+ on the right side and 2+ on the left side.     Heart sounds: Normal heart sounds. No murmur heard.   Pulmonary:     Effort: Pulmonary effort is normal. No respiratory distress.     Breath sounds: Normal breath sounds. No  wheezing or rales.  Chest:     Chest wall: No deformity, swelling, tenderness, crepitus or edema.  Abdominal:     General: Bowel sounds are normal. There is no distension.     Palpations: Abdomen is soft.     Tenderness: There is abdominal tenderness in the epigastric area. There is no guarding or rebound.     Comments: Patient belching at time of my exam.  Musculoskeletal:        General: No deformity.     Cervical back: Normal range of motion and neck supple. No crepitus. No pain with movement or spinous process tenderness.  Right lower leg: No edema.     Left lower leg: No edema.  Lymphadenopathy:     Cervical: No cervical adenopathy.  Skin:    General: Skin is warm and dry.  Neurological:     General: No focal deficit present.     Mental Status: She is alert and oriented to person, place, and time.     Cranial Nerves: Cranial nerves are intact.     Sensory: Sensation is intact.     Motor: Motor function is intact.  Psychiatric:        Mood and Affect: Mood normal.     ED Results / Procedures / Treatments   Labs (all labs ordered are listed, but only abnormal results are displayed) Labs Reviewed  BASIC METABOLIC PANEL  CBC  I-STAT BETA HCG BLOOD, ED ([facility], [facility], [facility] ONLY)  TROPONIN I (HIGH SENSITIVITY)  TROPONIN I (HIGH SENSITIVITY)    EKG EKG Interpretation  Date/Time:  Wednesday March 02 2020 12:12:01 EST Ventricular Rate:  70 PR Interval:  160 QRS Duration: 70 QT Interval:  404 QTC Calculation: 436 R Axis:   58 Text Interpretation: Normal sinus rhythm Septal infarct , age undetermined Abnormal ECG No STEMI Confirmed by Alvester Chou [phone]) on 03/02/2020 4:27:19 PM   Radiology DG Chest 2 View  Result Date: 03/02/2020 CLINICAL DATA:  Chest pain EXAM: CHEST - 2 VIEW COMPARISON:  01/26/2020 FINDINGS: The heart size and mediastinal contours are within normal limits. Both lungs are clear. The visualized skeletal structures are unremarkable. IMPRESSION:  Negative. Electronically Signed   By: Charlett Nose M.D.   On: 03/02/2020 13:14    Procedures Procedures   Medications Ordered in ED Medications  alum & mag hydroxide-simeth (MAALOX/MYLANTA) 200-200-20 MG/5ML suspension 30 mL (30 mLs Oral Given 03/02/20 1654)    And  lidocaine (XYLOCAINE) 2 % viscous mouth solution 15 mL (15 mLs Oral Given 03/02/20 1654)    ED Course  I have reviewed the triage vital signs and the nursing notes.  Pertinent labs & imaging results that were available during my care of the patient were reviewed by me and considered in my medical decision making (see chart for details).  Clinical Course as of 03/02/20 1847  Wed Mar 02, 2020  1746 Patient reevaluated after administration of GI cocktail.  She endorses significant improvement in her pain, states it is nearly gone.  She endorses significant improvement in her epigastric tenderness to palpation as well.  Additionally she is now requesting food, and states she has not had an appetite for the last 2 days.  Water and snacks provided. [RS]    Clinical Course User Index [RS] Noella Kipnis, Eugene Gavia, PA-C   MDM Rules/Calculators/A&P HEAR Score: 38                       50 year old female with history of hypertension, hyperlipidemia, and SVT who presents with 2 days of epigastric and central chest pain, belching, nausea.  Differential diagnosis of epigastric pain includes but is not limited to: Functional or nonulcer dyspepsia , PUD, GERD, Gastritis, (NSAIDs, alcohol, stress, H. pylori, pernicious anemia), pancreatitis / pancreatic cancer, overeating indigestion (high-fat foods, coffee), drugs (aspirin, antibiotics (eg, macrolides, metronidazole), corticosteroids, digoxin, narcotics, theophylline), gastroparesis, gastric volvulus, gastric cancer, lactose intolerance, malabsorption, parasitic infection (Giardia, Strongyloides, Ascaris), abdominal hernia, intestinal ischemia, esophageal rupture,  cholelithiasis  /choledocholithiasis / cholangitis, hepatitis, ACS, pericarditis, pneumonia, pregnancy.  Vital signs are normal on intake.  Cardiopulmonary exam is normal, abdominal exam  revealed epigastric tenderness to palpation.  Patient is belching at time of my exam.  She appears uncomfortable.  Patient is neurovascularly intact in all 4 extremities.  Basic laboratory studies obtained in triage.  CBC unremarkable, BMP unremarkable, initial troponin negative, 4.  CXR negative for acute cardiopulmonary disease, EKG normal sinus rhythm, no STEMI.  Will proceed with GI cocktail and delta troponin at this time.  Pain improved after GI cocktail, delta troponin negative, 6. Given reassuring physical exam, vital signs, laboratory studies and imaging studies today, as well as reassuring CTA of the coronary arteries 4 months ago, do not feel this patient symptoms were from an ACS or emergent cardiac etiology. Suspect patient symptoms are secondary to severe GERD flare, she has been under more stress at work recently, and has not been taking her medications.  No further work-up is warranted in the ED at this time. She should continue daily Pepcid which she has at home, will refill her home medications. Will give contact information for [facility].  EAVWUJWJSayyidah voiced understanding of her medical evaluation and treatment plan. Each of her questions was answered to her expressed satisfaction. Return precautions given. Patient is stable and appropriate for discharge at this time.  This chart was dictated using voice recognition software, Dragon. Despite the best efforts of this provider to proofread and correct errors, errors may still occur which can change documentation meaning.  Final Clinical Impression(s) / ED Diagnoses Final diagnoses:  Gastroesophageal reflux disease, unspecified whether esophagitis present    Rx / DC Orders ED Discharge Orders         Ordered    losartan (COZAAR) 25 MG tablet   Daily        03/02/20 1821    atorvastatin (LIPITOR) 20 MG tablet  Daily        03/02/20 1821    hydrOXYzine (ATARAX/VISTARIL) 25 MG tablet  Every 8 hours PRN        03/02/20 1821           Lillah Standre, Eugene GaviaRebekah R, PA-C 03/02/20 1847    Terald Sleeperrifan, Matthew J, MD 03/02/20 2007

## 2020-03-02 NOTE — ED Triage Notes (Signed)
Pt reports cp x2 days first felt like acid reflux has been taking Pepcid for 2 days with no change now having pain up into chest and upper mid back.

## 2020-03-02 NOTE — ED Notes (Signed)
Was not able to collect 2nd Trop

## 2020-03-02 NOTE — Discharge Instructions (Signed)
You were evaluated in the emergency department today for your chest and belly pain. Your physical exam, vital signs, blood work, EKG, and chest x-ray were very reassuring. There does not appear to be any emergent problem with your heart or your lungs. It appears that today's symptoms are secondary to acute flare of your acid reflux. You were administered a medication in the emergency department we call a GI cocktail that significantly improved your pain, and gave [place] good indication that GERD (gastroesophageal reflux disease) is the source of your pain.  You should continue take your Pepcid daily for the next 30 days. Additionally, attached is a list of foods to avoid to prevent flares of GERD in the future.  Below is the contact information for [facility], the free clinic associated with [place]. Please call them tomorrow to schedule a follow-up appointment, so that they may refill your medications as needed. Your home medications for high blood pressure and high cholesterol have been sent to the pharmacy, along with your anxiety medicine. While you are at the pharmacy I would encourage you to pick up a medication called Maalox, also known as Mylanta. This was part of the medication that you drank in the emergency department that improved your reflux pain. You can pick this up over-the-counter.  Return to the emergency department if you develop any worsening chest pain, shortness of breath, palpitations, nausea or vomiting that does not stop, or any other new severe symptoms.

## 2020-04-13 ENCOUNTER — Other Ambulatory Visit (HOSPITAL_COMMUNITY): Payer: Self-pay | Admitting: Emergency Medicine

## 2020-04-13 ENCOUNTER — Emergency Department (HOSPITAL_COMMUNITY)
Admission: EM | Admit: 2020-04-13 | Discharge: 2020-04-13 | Disposition: A | Discharge status: Home / Self Care | Payer: Medicaid Other | Attending: Emergency Medicine | Admitting: Emergency Medicine

## 2020-04-13 ENCOUNTER — Encounter (HOSPITAL_COMMUNITY): Payer: Self-pay

## 2020-04-13 ENCOUNTER — Emergency Department (HOSPITAL_COMMUNITY): Payer: Medicaid Other

## 2020-04-13 DIAGNOSIS — F17210 Nicotine dependence, cigarettes, uncomplicated: Secondary | ICD-10-CM | POA: Insufficient documentation

## 2020-04-13 DIAGNOSIS — R1013 Epigastric pain: Secondary | ICD-10-CM | POA: Insufficient documentation

## 2020-04-13 DIAGNOSIS — R079 Chest pain, unspecified: Secondary | ICD-10-CM

## 2020-04-13 DIAGNOSIS — K219 Gastro-esophageal reflux disease without esophagitis: Secondary | ICD-10-CM | POA: Insufficient documentation

## 2020-04-13 LAB — CBC WITH DIFFERENTIAL/PLATELET
Abs Immature Granulocytes: 0.01 10*3/uL (ref 0.00–0.07)
Basophils Absolute: 0.1 10*3/uL (ref 0.0–0.1)
Basophils Relative: 1 %
Eosinophils Absolute: 0.1 10*3/uL (ref 0.0–0.5)
Eosinophils Relative: 1 %
HCT: 38 % (ref 36.0–46.0)
Hemoglobin: 12.3 g/dL (ref 12.0–15.0)
Immature Granulocytes: 0 %
Lymphocytes Relative: 50 %
Lymphs Abs: 2.5 10*3/uL (ref 0.7–4.0)
MCH: 28.7 pg (ref 26.0–34.0)
MCHC: 32.4 g/dL (ref 30.0–36.0)
MCV: 88.6 fL (ref 80.0–100.0)
Monocytes Absolute: 0.4 10*3/uL (ref 0.1–1.0)
Monocytes Relative: 8 %
Neutro Abs: 2 10*3/uL (ref 1.7–7.7)
Neutrophils Relative %: 40 %
Platelets: UNDETERMINED 10*3/uL (ref 150–400)
RBC: 4.29 MIL/uL (ref 3.87–5.11)
RDW: 13.3 % (ref 11.5–15.5)
WBC: 5 10*3/uL (ref 4.0–10.5)
nRBC: 0 % (ref 0.0–0.2)

## 2020-04-13 LAB — COMPREHENSIVE METABOLIC PANEL
ALT: 20 U/L (ref 0–44)
AST: 34 U/L (ref 15–41)
Albumin: 4.1 g/dL (ref 3.5–5.0)
Alkaline Phosphatase: 54 U/L (ref 38–126)
Anion gap: 9 (ref 5–15)
BUN: 10 mg/dL (ref 6–20)
CO2: 25 mmol/L (ref 22–32)
Calcium: 9.7 mg/dL (ref 8.9–10.3)
Chloride: 103 mmol/L (ref 98–111)
Creatinine, Ser: 0.72 mg/dL (ref 0.44–1.00)
GFR, Estimated: >60 mL/min (ref >60)
Glucose, Bld: 85 mg/dL (ref 70–99)
Potassium: 5 mmol/L (ref 3.5–5.1)
Sodium: 137 mmol/L (ref 135–145)
Total Bilirubin: 1.8 mg/dL — ABNORMAL HIGH (ref 0.3–1.2)
Total Protein: 7.1 g/dL (ref 6.5–8.1)

## 2020-04-13 LAB — LIPASE, BLOOD: Lipase: 27 U/L (ref 11–51)

## 2020-04-13 LAB — TROPONIN I (HIGH SENSITIVITY): Troponin I (High Sensitivity): 3 ng/L (ref <18)

## 2020-04-13 MED ORDER — FAMOTIDINE 20 MG PO TABS: 20.0000 mg | ORAL_TABLET | Freq: Once | ORAL | Status: DC

## 2020-04-13 MED ORDER — ATORVASTATIN CALCIUM 20 MG PO TABS: 20.0000 mg | ORAL_TABLET | Freq: Every day | ORAL | 0 refills | Status: DC

## 2020-04-13 MED ORDER — LIDOCAINE VISCOUS HCL 2 % MT SOLN
15.0000 mL | Freq: Once | OROMUCOSAL | Status: AC
  Administered 2020-04-13: 15 mL via ORAL
  Filled 2020-04-13: qty 15

## 2020-04-13 MED ORDER — LOSARTAN POTASSIUM 25 MG PO TABS: 25.0000 mg | ORAL_TABLET | Freq: Every day | ORAL | 0 refills | Status: DC

## 2020-04-13 MED ORDER — OMEPRAZOLE 20 MG PO CPDR: 20.0000 mg | DELAYED_RELEASE_CAPSULE | Freq: Every day | ORAL | 1 refills | Status: DC

## 2020-04-13 MED ORDER — ALUM & MAG HYDROXIDE-SIMETH 200-200-20 MG/5ML PO SUSP
30.0000 mL | Freq: Once | ORAL | Status: AC
  Administered 2020-04-13: 30 mL via ORAL
  Filled 2020-04-13: qty 30

## 2020-04-13 MED ORDER — HYDROXYZINE HCL 25 MG PO TABS: 25.0000 mg | ORAL_TABLET | Freq: Three times a day (TID) | ORAL | 0 refills | Status: DC | PRN

## 2020-04-13 MED ORDER — PANTOPRAZOLE SODIUM 40 MG PO TBEC
40.0000 mg | DELAYED_RELEASE_TABLET | Freq: Every day | ORAL | Status: DC
  Administered 2020-04-13: 40 mg via ORAL
  Filled 2020-04-13: qty 1

## 2020-04-13 MED FILL — LOSARTAN POTASSIUM 25 MG TA: 25 | 30 days supply | Qty: 30 | Fill #0

## 2020-04-13 MED FILL — ATORVASTATIN CALCIUM 20 MG: 20 | 30 days supply | Qty: 30 | Fill #0

## 2020-04-13 MED FILL — hydrOXYzine HCL 25 MG TABS: 25 | 4 days supply | Qty: 12 | Fill #0

## 2020-04-13 MED FILL — OMEPRAZOLE 20 MG CPDR: 20 | 28 days supply | Qty: 28 | Fill #0

## 2020-04-13 NOTE — ED Triage Notes (Signed)
Patient reports epigastric pain/chest pain with anxiety-ongoing. Reports is out of medication and is pending appointment with the clinic.

## 2020-04-13 NOTE — ED Notes (Signed)
Tried to get patient blood 2X's and Had no success.

## 2020-04-13 NOTE — ED Notes (Signed)
unsuccessful x 2 IV stick attempts. Will contact lab for blood draw.

## 2020-04-13 NOTE — ED Notes (Signed)
ED RN unsuccessful with [place] IV attempt. IV team order placed.

## 2020-04-13 NOTE — ED Notes (Signed)
Physician at bedside for [place] IV placement.

## 2020-04-13 NOTE — ED Notes (Signed)
ED Provider at bedside. 

## 2020-04-13 NOTE — ED Notes (Signed)
Prescription medications delivered to patient bedside by pharmacist.

## 2020-04-13 NOTE — ED Provider Notes (Addendum)
[facility] EMERGENCY DEPARTMENT Provider Note   CSN: [id number] Arrival date & time: 04/13/20  0354     History Chief Complaint  Patient presents with  . Chest Pain    Joyce Peters is a 50 y.o. female.  HPI Patient is a 50 year old female with a past medical history significant for SVT with elevated troponin, reflux, anxiety  Patient is presented today with epigastric abdominal pain/chest pain she states that this is been bothering further for the past several days.  She states that it is nonexertional, nonpleuritic.  She states it is somewhat worse when laying down.  She denies any trauma.  She denies any exertional dyspnea.  She denies any vomiting but does endorse occasional nausea.  She states is been an issue for her many times in the past.  She states that she has taken Pepcid in the past but is not taking any recently up until this morning when she took 1 tablet before coming to the ER.  She also states that she has a follow-up ointment April 11 in the [facility] however she is out of her medications would like a refill today.  She denies any radiation of pain.  No other significant aggravating mitigating factors.  She denies any history of recent travel, hemoptysis, pleuritic chest pain, unilateral or bilateral leg swelling or calf tenderness.  She also denies any history of VTE.  No personal cancer history.  She is a every day smoker denies any recreational drug use.  She does state that she drinks exorbitant amounts of mint tea every day.    Past Medical History:  Diagnosis Date  . SVT (supraventricular tachycardia) (HCC) 10/20/2019    Patient Active Problem List   Diagnosis Date Noted  . SVT (supraventricular tachycardia) (HCC) 10/20/2019  . Elevated troponin 10/20/2019  . Chest pain 10/20/2019    Past Surgical History:  Procedure Laterality Date  . CESAREAN SECTION    . CESAREAN SECTION       OB History   No obstetric  history on file.     Family History  Problem Relation Age of Onset  . CAD Mother     Social History   Tobacco Use  . Smoking status: Current Every Day Smoker    Packs/day: 0.25    Types: Cigarettes  . Smokeless tobacco: Never Used  Vaping Use  . Vaping Use: Never used  Substance Use Topics  . Alcohol use: Not Currently  . Drug use: Not Currently    Home Medications Prior to Admission medications   Medication Sig Start Date End Date Taking? Authorizing Provider  omeprazole (PRILOSEC) 20 MG capsule Take 1 capsule (20 mg total) by mouth daily for 28 days. 04/13/20 05/11/20 Yes Cung Masterson, Stevphen Meuse S, PA  acetaminophen (TYLENOL) 500 MG tablet Take 500 mg by mouth every 6 (six) hours as needed for mild pain.    [provider]  atorvastatin (LIPITOR) 20 MG tablet Take 1 tablet (20 mg total) by mouth daily. 04/13/20 05/13/20  Gailen Shelter, PA  hydrOXYzine (ATARAX/VISTARIL) 25 MG tablet Take 1 tablet (25 mg total) by mouth every 8 (eight) hours as needed. 04/13/20   Gailen Shelter, PA  losartan (COZAAR) 25 MG tablet Take 1 tablet (25 mg total) by mouth daily. 04/13/20 05/13/20  Gailen Shelter, PA    Allergies    Shellfish allergy and Contrast media [iodinated diagnostic agents]  Review of Systems   Review of Systems  Constitutional: Negative for  chills and fever.  HENT: Negative for congestion.   Eyes: Negative for pain.  Respiratory: Negative for cough and shortness of breath.   Cardiovascular: Positive for chest pain (burning). Negative for leg swelling.  Gastrointestinal: Positive for abdominal pain and nausea. Negative for diarrhea and vomiting.  Genitourinary: Negative for dysuria.  Musculoskeletal: Negative for myalgias.  Skin: Negative for rash.  Neurological: Negative for dizziness and headaches.    Physical Exam Updated Vital Signs BP 140/77   Pulse (!) 58   Temp 97.9 F (36.6 C) (Oral)   Resp 19   SpO2 100%   Physical Exam Vitals and nursing note  reviewed.  Constitutional:      General: She is not in acute distress. HENT:     Head: Normocephalic and atraumatic.     Nose: Nose normal.     Mouth/Throat:     Mouth: Mucous membranes are moist.  Eyes:     General: No scleral icterus. Cardiovascular:     Rate and Rhythm: Normal rate and regular rhythm.     Pulses: Normal pulses.     Heart sounds: Normal heart sounds.     Comments: BL radial pulses 3+ Pulmonary:     Effort: Pulmonary effort is normal. No respiratory distress.     Breath sounds: Normal breath sounds. No wheezing.  Chest:     Chest wall: No tenderness.  Abdominal:     Palpations: Abdomen is soft.     Tenderness: There is no abdominal tenderness. There is no guarding or rebound.  Musculoskeletal:     Cervical back: Normal range of motion.     Right lower leg: No edema.     Left lower leg: No edema.  Skin:    General: Skin is warm and dry.     Capillary Refill: Capillary refill takes less than 2 seconds.  Neurological:     Mental Status: She is alert. Mental status is at baseline.  Psychiatric:        Mood and Affect: Mood normal.        Behavior: Behavior normal.     ED Results / Procedures / Treatments   Labs (all labs ordered are listed, but only abnormal results are displayed) Labs Reviewed  COMPREHENSIVE METABOLIC PANEL - Abnormal; Notable for the following components:      Result Value   Total Bilirubin 1.8 (*)    All other components within normal limits  CBC WITH DIFFERENTIAL/PLATELET  LIPASE, BLOOD  TROPONIN I (HIGH SENSITIVITY)    EKG None  Radiology DG Chest Port 1 View  Result Date: 04/13/2020 CLINICAL DATA:  Chest pain. EXAM: PORTABLE CHEST 1 VIEW COMPARISON:  03/02/2020. FINDINGS: Mediastinum and hilar structures normal. Heart size normal. No focal infiltrate. No pleural effusion or pneumothorax. Degenerative changes scoliosis thoracic spine. IMPRESSION: No acute cardiopulmonary disease. Electronically Signed   By: Maisie Fus  Register    On: 04/13/2020 07:53    Procedures Procedures   Medications Ordered in ED Medications  pantoprazole (PROTONIX) EC tablet 40 mg (40 mg Oral Given 04/13/20 0856)  alum & mag hydroxide-simeth (MAALOX/MYLANTA) 200-200-20 MG/5ML suspension 30 mL (30 mLs Oral Given 04/13/20 0754)    And  lidocaine (XYLOCAINE) 2 % viscous mouth solution 15 mL (15 mLs Oral Given 04/13/20 0754)    ED Course  I have reviewed the triage vital signs and the nursing notes.  Pertinent labs & imaging results that were available during my care of the patient were reviewed by me and considered in my  medical decision making (see chart for details).    MDM Rules/Calculators/A&P                          Patient is 50 year old female presented today with burning chest pain that she states is worse with reclining does not seem to be pleuritic or exertional.  Physical exam is unremarkable.  Lipase within normal limits on pancreatitis.  Troponin x1 is 3.  CMP unremarkable.  CBC without leukocytosis or anemia.  Platelet clumps made platelet count estimation difficult.  This is likely a result of difficult draw.  Chest x-ray unremarkable.  EKG nonischemic.  Patient symptoms are completely resolved on my reevaluation.  She received Protonix and GI cocktail.  Doubt PE as patient denies pleuritic component of CP, denies history of clot, active cancer, immobilization, surgery, hemoptysis, known clotting disorder, is on no estrogen containing medications, denies calf pain and on physical exam has no unilateral leg swelling, calf TTP, tachycardia or hypoxia.   Doubt ACS as patient has few risk factors. Patient's CP is atypical for ACS. Is non-radiating, substernal, not described as chest pressure and is not precipitated/worsened by exertion.  Doubt thoracic aortic dissection as pain lacks tearing or severe quality and does not radiate to back. Patient denies nausea, diaphoresis.   Doubt pericarditis as no recent illness, PE  without muffled heart sounds or friction rub, CP is non-pleuritic, EKG without global STE or PR depression.  Given return precautions.  Discharged with omeprazole and follow-up with PCP. I did refill patient's ongoing long-term medications.  Patient was seen by transitional care team [facility] who will help patient with follow-up with PCP. Final Clinical Impression(s) / ED Diagnoses Final diagnoses:  Gastroesophageal reflux disease, unspecified whether esophagitis present  Chest pain, unspecified type    Rx / DC Orders ED Discharge Orders         Ordered    omeprazole (PRILOSEC) 20 MG capsule  Daily        04/13/20 1152    atorvastatin (LIPITOR) 20 MG tablet  Daily        04/13/20 1209    hydrOXYzine (ATARAX/VISTARIL) 25 MG tablet  Every 8 hours PRN        04/13/20 1209    losartan (COZAAR) 25 MG tablet  Daily        04/13/20 [address] [place] 04/13/20 1204    Solon Augusta [place], [place] 04/13/20 1220    Blane Ohara, MD 04/16/20 0945

## 2020-04-13 NOTE — Discharge Planning (Signed)
RNCM consulted for medication assistance. RNCM reviewed chart and spoke with the uninsured pt about [facility] MATCH program ($3 co pay for each Rx through [facility] program, does not include refills, 7 day expiration of MATCH letter and choice of pharmacies). Pt is eligible for [facility] MATCH program (unable to find pt listed in PROCARE per cardholder name inquiry) and has agreed to accept MATCH under terms discussed. PROCARE information entered; [facility] to fill and deliver to pt at bedside prior to discharge home today. RNCM updated EDP and ED RN.

## 2020-04-13 NOTE — ED Notes (Signed)
Pharmacy to deliver medications to room prior to discharge.

## 2020-04-13 NOTE — Discharge Instructions (Addendum)
Please read the attached information about reflux. Your chest pain work-up today was reassuring.  Please take the omeprazole that I prescribed you and follow-up with your primary care doctor.

## 2020-04-25 ENCOUNTER — Encounter: Payer: Medicaid Other | Admitting: Obstetrics and Gynecology

## 2020-04-28 ENCOUNTER — Inpatient Hospital Stay (INDEPENDENT_AMBULATORY_CARE_PROVIDER_SITE_OTHER): Payer: Medicaid Other | Admitting: Primary Care

## 2020-06-15 ENCOUNTER — Emergency Department (HOSPITAL_COMMUNITY): Payer: 59

## 2020-06-15 ENCOUNTER — Other Ambulatory Visit: Payer: Self-pay

## 2020-06-15 ENCOUNTER — Emergency Department (HOSPITAL_COMMUNITY)
Admission: EM | Admit: 2020-06-15 | Discharge: 2020-06-15 | Disposition: A | Discharge status: Home / Self Care | Payer: 59 | Attending: Emergency Medicine | Admitting: Emergency Medicine

## 2020-06-15 DIAGNOSIS — F17210 Nicotine dependence, cigarettes, uncomplicated: Secondary | ICD-10-CM | POA: Insufficient documentation

## 2020-06-15 DIAGNOSIS — R079 Chest pain, unspecified: Secondary | ICD-10-CM | POA: Diagnosis not present

## 2020-06-15 LAB — CBC
HCT: 37.9 % (ref 36.0–46.0)
Hemoglobin: 11.8 g/dL — ABNORMAL LOW (ref 12.0–15.0)
MCH: 27.7 pg (ref 26.0–34.0)
MCHC: 31.1 g/dL (ref 30.0–36.0)
MCV: 89 fL (ref 80.0–100.0)
Platelets: 285 10*3/uL (ref 150–400)
RBC: 4.26 MIL/uL (ref 3.87–5.11)
RDW: 13.6 % (ref 11.5–15.5)
WBC: 8.6 10*3/uL (ref 4.0–10.5)
nRBC: 0 % (ref 0.0–0.2)

## 2020-06-15 LAB — BASIC METABOLIC PANEL
Anion gap: 7 (ref 5–15)
BUN: 11 mg/dL (ref 6–20)
CO2: 30 mmol/L (ref 22–32)
Calcium: 9.3 mg/dL (ref 8.9–10.3)
Chloride: 104 mmol/L (ref 98–111)
Creatinine, Ser: 0.79 mg/dL (ref 0.44–1.00)
GFR, Estimated: >60 mL/min (ref >60)
Glucose, Bld: 100 mg/dL — ABNORMAL HIGH (ref 70–99)
Potassium: 3.8 mmol/L (ref 3.5–5.1)
Sodium: 141 mmol/L (ref 135–145)

## 2020-06-15 LAB — TROPONIN I (HIGH SENSITIVITY)
Troponin I (High Sensitivity): 5 ng/L (ref <18)
Troponin I (High Sensitivity): 6 ng/L (ref <18)

## 2020-06-15 MED ORDER — ALUM & MAG HYDROXIDE-SIMETH 200-200-20 MG/5ML PO SUSP
30.0000 mL | Freq: Once | ORAL | Status: AC
  Administered 2020-06-15: 30 mL via ORAL
  Filled 2020-06-15: qty 30

## 2020-06-15 MED ORDER — OMEPRAZOLE 20 MG PO CPDR: DELAYED_RELEASE_CAPSULE | ORAL | 0 refills | Status: DC

## 2020-06-15 NOTE — Discharge Instructions (Signed)
Below is a referral for [employer] [facility].  Please give them a call and schedule a follow-up appointment.  They can help you find a primary care provider.  Below is also a referral to a local cardiology office here in town.  Please give them a call and schedule an appointment for reevaluation.  I have refilled your prescription for Prilosec.  I have printed off a prescription.  Please have this refilled and begin taking once a day.  If you develop any new or worsening symptoms, please come back to the emergency department for reevaluation.  It was a pleasure to meet you.

## 2020-06-15 NOTE — ED Provider Notes (Signed)
[facility] EMERGENCY DEPARTMENT Provider Note   CSN: [id number] Arrival date & time: 06/15/20  1618     History Chief Complaint  Patient presents with  . Chest Pain    Joyce Peters is a 50 y.o. female.  HPI Patient is a 50 year old female with a history of GERD, hypertension, hyperlipidemia, who presents to the emergency department due to chest pain.  Patient states her symptoms started about 9 hours ago while she was sweeping the floor.  She began experiencing a sharp/squeezing left-sided chest pain that radiated to the left upper arm.  This lasted about 10 minutes and then resolved.  She now notes pain in the left upper arm.  She states initially her symptoms were similar to her prior GERD symptoms but the arm pain was different.  She does note that she feels pressure in her upper stomach as well as a desire to burp which is consistent with her GERD symptoms.  She denies any shortness of breath when this occurred but does note some diaphoresis.  No abdominal pain, nausea, vomiting, fevers, cough.  Denies any history of blood clots, hemoptysis, unilateral leg swelling, estrogen/hormone use, recent travel, recent immobilization, recent surgeries.    Past Medical History:  Diagnosis Date  . SVT (supraventricular tachycardia) (HCC) 10/20/2019    Patient Active Problem List   Diagnosis Date Noted  . SVT (supraventricular tachycardia) (HCC) 10/20/2019  . Elevated troponin 10/20/2019  . Chest pain 10/20/2019    Past Surgical History:  Procedure Laterality Date  . CESAREAN SECTION    . CESAREAN SECTION       OB History   No obstetric history on file.     Family History  Problem Relation Age of Onset  . CAD Mother     Social History   Tobacco Use  . Smoking status: Current Every Day Smoker    Packs/day: 0.25    Types: Cigarettes  . Smokeless tobacco: Never Used  Vaping Use  . Vaping Use: Never used  Substance Use Topics  . Alcohol use: Not  Currently  . Drug use: Not Currently    Home Medications Prior to Admission medications   Medication Sig Start Date End Date Taking? Authorizing Provider  acetaminophen (TYLENOL) 500 MG tablet Take 500 mg by mouth every 6 (six) hours as needed for mild pain.    [provider]  atorvastatin (LIPITOR) 20 MG tablet TAKE 1 TABLET (20 MG TOTAL) BY MOUTH DAILY. 04/13/20 04/13/21  Gailen Shelter, PA  hydrOXYzine (ATARAX/VISTARIL) 25 MG tablet TAKE 1 TABLET (25 MG TOTAL) BY MOUTH EVERY EIGHT HOURS AS NEEDED. 04/13/20 04/13/21  Gailen Shelter, PA  losartan (COZAAR) 25 MG tablet TAKE 1 TABLET (25 MG TOTAL) BY MOUTH DAILY. 04/13/20 04/13/21  Gailen Shelter, PA  omeprazole (PRILOSEC) 20 MG capsule TAKE 1 CAPSULE (20 MG TOTAL) BY MOUTH DAILY FOR 28 DAYS. 06/15/20 06/15/21  Placido Sou, PA-C    Allergies    Shellfish allergy, Contrast media [iodinated diagnostic agents], and Lactose intolerance (gi)  Review of Systems   Review of Systems  All other systems reviewed and are negative. Ten systems reviewed and are negative for acute change, except as noted in the HPI.   Physical Exam Updated Vital Signs BP (!) 147/88   Pulse 92   Temp 98.7 F (37.1 C) (Oral)   Resp 11   SpO2 99%   Physical Exam Vitals and nursing note reviewed.  Constitutional:      General: She  is not in acute distress.    Appearance: Normal appearance. She is not ill-appearing, toxic-appearing or diaphoretic.  HENT:     Head: Normocephalic and atraumatic.     Right Ear: External ear normal.     Left Ear: External ear normal.     Nose: Nose normal.     Mouth/Throat:     Mouth: Mucous membranes are moist.     Pharynx: Oropharynx is clear. No oropharyngeal exudate or posterior oropharyngeal erythema.  Eyes:     Extraocular Movements: Extraocular movements intact.  Cardiovascular:     Rate and Rhythm: Normal rate and regular rhythm.     Pulses: Normal pulses.          Radial pulses are 2+ on the right side and  2+ on the left side.       Dorsalis pedis pulses are 2+ on the right side and 2+ on the left side.     Heart sounds: Normal heart sounds. No murmur heard. No friction rub. No gallop.   Pulmonary:     Effort: Pulmonary effort is normal. No respiratory distress.     Breath sounds: Normal breath sounds. No stridor. No decreased breath sounds, wheezing, rhonchi or rales.  Abdominal:     General: Abdomen is flat.     Palpations: Abdomen is soft.     Tenderness: There is no abdominal tenderness.     Comments: Abdomen is flat, soft, and nontender in all 4 quadrants.  Musculoskeletal:        General: Normal range of motion.     Cervical back: Normal range of motion and neck supple. No tenderness.     Right lower leg: No tenderness. No edema.     Left lower leg: No tenderness. No edema.  Skin:    General: Skin is warm and dry.  Neurological:     General: No focal deficit present.     Mental Status: She is alert and oriented to person, place, and time.  Psychiatric:        Mood and Affect: Mood normal.        Behavior: Behavior normal.    ED Results / Procedures / Treatments   Labs (all labs ordered are listed, but only abnormal results are displayed) Labs Reviewed  BASIC METABOLIC PANEL - Abnormal; Notable for the following components:      Result Value   Glucose, Bld 100 (*)    All other components within normal limits  CBC - Abnormal; Notable for the following components:   Hemoglobin 11.8 (*)    All other components within normal limits  TROPONIN I (HIGH SENSITIVITY)  TROPONIN I (HIGH SENSITIVITY)   EKG EKG Interpretation  Date/Time:  Wednesday June 15 2020 17:14:06 EDT Ventricular Rate:  85 PR Interval:  172 QRS Duration: 78 QT Interval:  396 QTC Calculation: 471 R Axis:   58 Text Interpretation: Normal sinus rhythm Abnormal QRS-T angle, consider primary T wave abnormality Abnormal ECG Nonspecific inferior ST changes similar to prior. Confirmed by Benjiman Core  [phone]) on 06/15/2020 9:54:29 PM   Radiology DG Chest 2 View  Result Date: 06/15/2020 CLINICAL DATA:  Chest pain radiating to the left for a few days EXAM: CHEST - 2 VIEW COMPARISON:  04/13/2020 FINDINGS: The heart size and mediastinal contours are within normal limits. Both lungs are clear. The visualized skeletal structures are unremarkable. IMPRESSION: No active cardiopulmonary disease. Electronically Signed   By: Alcide Clever M.D.   On: 06/15/2020 17:56  Procedures Procedures   Medications Ordered in ED Medications  alum & mag hydroxide-simeth (MAALOX/MYLANTA) 200-200-20 MG/5ML suspension 30 mL (30 mLs Oral Given 06/15/20 2159)   ED Course  I have reviewed the triage vital signs and the nursing notes.  Pertinent labs & imaging results that were available during my care of the patient were reviewed by me and considered in my medical decision making (see chart for details).    MDM Rules/Calculators/A&P                          Pt is a 50 y.o. female who presents to the emergency department due to chest pain.  Labs: CBC with a hemoglobin of 11.8. BMP with a glucose of 100. Troponin of 5 with a repeat of 6.   Imaging: Chest x-ray is negative.  ECG: Nonspecific inferior ST changes similar to prior.  I, Placido Sou, PA-C, personally reviewed and evaluated these images and lab results as part of my medical decision-making.  PERC negative.  Heart score 5.  Patient now notes that her symptoms are more consistent with her prior GERD exacerbations.  She was given a GI cocktail and notes significant relief of her sx.   Chest x-ray is negative, troponin is reassuring, and ECG appears similar to prior.  Doubt ACS though the patient has risk factors with a heart score of 5.  Recommended close follow-up with the patient.  She was given a referral to a local cardiologist.  Renae Gloss her to make sure that she follows up with them as soon as possible for reevaluation.  She states she also does  not have a primary doctor so she was given a referral to [facility] wellness as well.  We discussed return precautions in length.  Feel that she is stable for discharge at this time and she is agreeable.  She verbalized understanding of the above plan.  Her questions were answered and she was amicable at the time of discharge.  Note: Portions of this report may have been transcribed using voice recognition software. Every effort was made to ensure accuracy; however, inadvertent computerized transcription errors may be present.   Final Clinical Impression(s) / ED Diagnoses Final diagnoses:  Chest pain, unspecified type   Rx / DC Orders ED Discharge Orders         Ordered    omeprazole (PRILOSEC) 20 MG capsule        06/15/20 2309           Placido Sou, PA-C 06/15/20 2319    Benjiman Core, MD 06/15/20 2321

## 2020-06-15 NOTE — ED Triage Notes (Signed)
Pt reports central chest pain radiating to L arm x 2 days. Endorses shob. Pt has hx of GERD and thought this was same but sts now it feels different.

## 2020-06-15 NOTE — ED Provider Notes (Signed)
Emergency Medicine Provider Triage Evaluation Note  Joyce Peters , a 50 y.o. female  was evaluated in triage.  Pt complains of cp.  Review of Systems  Positive: Chest pain, heart palpitation, headache, L arm pain, diaphroresis Negative: Fever, cough, abd pain  Physical Exam  BP (!) 141/92 (BP Location: Left Arm)   Pulse 92   Temp 98.7 F (37.1 C) (Oral)   Resp 14   SpO2 99%  Gen:   Awake, no distress   Resp:  Normal effort  MSK:   Moves extremities without difficulty  Other:    Medical Decision Making  Medically screening exam initiated at 5:09 PM.  Appropriate orders placed.  TWSFKCLE Boss was informed that the remainder of the evaluation will be completed by another provider, this initial triage assessment does not replace that evaluation, and the importance of remaining in the ED until their evaluation is complete.  L sided chest pain and break out in a sweat while mopping at work earlier today.  Does have hx of SVT, denies increasing engergy drink. Does smoke.     Fayrene Helper, PA-C 06/15/20 1713    Gwyneth Sprout, MD 06/15/20 Zollie Pee

## 2020-07-26 ENCOUNTER — Other Ambulatory Visit: Payer: Self-pay

## 2020-07-26 ENCOUNTER — Encounter (HOSPITAL_BASED_OUTPATIENT_CLINIC_OR_DEPARTMENT_OTHER): Payer: Self-pay | Admitting: Emergency Medicine

## 2020-07-26 ENCOUNTER — Emergency Department (HOSPITAL_BASED_OUTPATIENT_CLINIC_OR_DEPARTMENT_OTHER)
Admission: EM | Admit: 2020-07-26 | Discharge: 2020-07-26 | Disposition: A | Discharge status: Home / Self Care | Payer: 59 | Attending: Emergency Medicine | Admitting: Emergency Medicine

## 2020-07-26 ENCOUNTER — Emergency Department (HOSPITAL_BASED_OUTPATIENT_CLINIC_OR_DEPARTMENT_OTHER): Payer: 59 | Admitting: Radiology

## 2020-07-26 DIAGNOSIS — R079 Chest pain, unspecified: Secondary | ICD-10-CM

## 2020-07-26 DIAGNOSIS — R002 Palpitations: Secondary | ICD-10-CM | POA: Diagnosis present

## 2020-07-26 DIAGNOSIS — F17210 Nicotine dependence, cigarettes, uncomplicated: Secondary | ICD-10-CM | POA: Diagnosis not present

## 2020-07-26 DIAGNOSIS — R0789 Other chest pain: Secondary | ICD-10-CM | POA: Insufficient documentation

## 2020-07-26 DIAGNOSIS — F419 Anxiety disorder, unspecified: Secondary | ICD-10-CM

## 2020-07-26 LAB — CBC
HCT: 39.1 % (ref 36.0–46.0)
Hemoglobin: 12.8 g/dL (ref 12.0–15.0)
MCH: 27.6 pg (ref 26.0–34.0)
MCHC: 32.7 g/dL (ref 30.0–36.0)
MCV: 84.4 fL (ref 80.0–100.0)
Platelets: 271 10*3/uL (ref 150–400)
RBC: 4.63 MIL/uL (ref 3.87–5.11)
RDW: 12.5 % (ref 11.5–15.5)
WBC: 5.3 10*3/uL (ref 4.0–10.5)
nRBC: 0 % (ref 0.0–0.2)

## 2020-07-26 LAB — BASIC METABOLIC PANEL
Anion gap: 13 (ref 5–15)
BUN: 11 mg/dL (ref 6–20)
CO2: 25 mmol/L (ref 22–32)
Calcium: 10.1 mg/dL (ref 8.9–10.3)
Chloride: 101 mmol/L (ref 98–111)
Creatinine, Ser: 0.81 mg/dL (ref 0.44–1.00)
GFR, Estimated: >60 mL/min (ref >60)
Glucose, Bld: 87 mg/dL (ref 70–99)
Potassium: 3.7 mmol/L (ref 3.5–5.1)
Sodium: 139 mmol/L (ref 135–145)

## 2020-07-26 LAB — TROPONIN I (HIGH SENSITIVITY)
Troponin I (High Sensitivity): 3 ng/L (ref <18)
Troponin I (High Sensitivity): 4 ng/L (ref <18)

## 2020-07-26 MED ORDER — HYDROXYZINE HCL 25 MG PO TABS
25.0000 mg | ORAL_TABLET | Freq: Once | ORAL | Status: AC
  Administered 2020-07-26: 25 mg via ORAL
  Filled 2020-07-26: qty 1

## 2020-07-26 MED ORDER — HYDROXYZINE HCL 25 MG PO TABS: ORAL_TABLET | ORAL | 0 refills | Status: DC

## 2020-07-26 NOTE — ED Notes (Signed)
Pt discharged home after verbalizing understanding of discharge instructions; nad noted. 

## 2020-07-26 NOTE — ED Triage Notes (Signed)
Pt arrives to ED with c/o of palpitations and rapid heart rate. Pt reports the feeling of palpitations and chest tightness x3 today each episode lasting over 5-10 minutes. Pt reports during the episodes she was walking upstairs and smoking a cigarette. Pt is NSR on monitor in triage.

## 2020-07-26 NOTE — ED Provider Notes (Signed)
[facility] EMERGENCY DEPT Provider Note   CSN: [id number] Arrival date & time: 07/26/20  1506     History Chief Complaint  Patient presents with   Palpitations    Joyce Peters is a 50 y.o. female.  50 year old female presents after having palpitations associate with chest tightness today.  States that she has been very anxious recently.  States symptoms lasted for about 5 to 10 minutes.  They were associated with smoking and walking.  States that when she relaxes they become better.  No associated diaphoresis or dyspnea.  Was on hydroxyzine in the past but has not taken anything recently.  Denies any SI or HI.  No recent cough or congestion.      Past Medical History:  Diagnosis Date   SVT (supraventricular tachycardia) (HCC) 10/20/2019    Patient Active Problem List   Diagnosis Date Noted   SVT (supraventricular tachycardia) (HCC) 10/20/2019   Elevated troponin 10/20/2019   Chest pain 10/20/2019    Past Surgical History:  Procedure Laterality Date   CESAREAN SECTION     CESAREAN SECTION       OB History   No obstetric history on file.     Family History  Problem Relation Age of Onset   CAD Mother     Social History   Tobacco Use   Smoking status: Every Day    Packs/day: 0.25    Pack years: 0.00    Types: Cigarettes   Smokeless tobacco: Never  Vaping Use   Vaping Use: Never used  Substance Use Topics   Alcohol use: Not Currently   Drug use: Not Currently    Home Medications Prior to Admission medications   Medication Sig Start Date End Date Taking? Authorizing Provider  acetaminophen (TYLENOL) 500 MG tablet Take 500 mg by mouth every 6 (six) hours as needed for mild pain.   Yes [provider]  atorvastatin (LIPITOR) 20 MG tablet TAKE 1 TABLET (20 MG TOTAL) BY MOUTH DAILY. 04/13/20 04/13/21 Yes Fondaw, Wylder S, PA  hydrOXYzine (ATARAX/VISTARIL) 25 MG tablet TAKE 1 TABLET (25 MG TOTAL) BY MOUTH EVERY EIGHT HOURS AS NEEDED.  04/13/20 04/13/21 Yes Fondaw, Wylder S, PA  losartan (COZAAR) 25 MG tablet TAKE 1 TABLET (25 MG TOTAL) BY MOUTH DAILY. 04/13/20 04/13/21 Yes Fondaw, Wylder S, PA  omeprazole (PRILOSEC) 20 MG capsule TAKE 1 CAPSULE (20 MG TOTAL) BY MOUTH DAILY FOR 28 DAYS. 06/15/20 06/15/21 Yes Placido Sou, PA-C    Allergies    Shellfish allergy, Contrast media [iodinated diagnostic agents], and Lactose intolerance (gi)  Review of Systems   Review of Systems  All other systems reviewed and are negative.  Physical Exam Updated Vital Signs BP (!) 149/82 (BP Location: Right Arm)   Pulse 66   Temp 98.5 F (36.9 C) (Oral)   Resp 18   Ht 1.702 m (5\' 7" )   Wt 90.7 kg   SpO2 100%   BMI 31.32 kg/m   Physical Exam Vitals and nursing note reviewed.  Constitutional:      General: She is not in acute distress.    Appearance: Normal appearance. She is well-developed. She is not toxic-appearing.  HENT:     Head: Normocephalic and atraumatic.  Eyes:     General: Lids are normal.     Conjunctiva/sclera: Conjunctivae normal.     Pupils: Pupils are equal, round, and reactive to light.  Neck:     Thyroid: No thyroid mass.     Trachea: No tracheal deviation.  Cardiovascular:     Rate and Rhythm: Normal rate and regular rhythm.     Heart sounds: Normal heart sounds. No murmur heard.   No gallop.  Pulmonary:     Effort: Pulmonary effort is normal. No respiratory distress.     Breath sounds: Normal breath sounds. No stridor. No decreased breath sounds, wheezing, rhonchi or rales.  Abdominal:     General: There is no distension.     Palpations: Abdomen is soft.     Tenderness: There is no abdominal tenderness. There is no rebound.  Musculoskeletal:        General: No tenderness. Normal range of motion.     Cervical back: Normal range of motion and neck supple.  Skin:    General: Skin is warm and dry.     Findings: No abrasion or rash.  Neurological:     Mental Status: She is alert and oriented to person,  place, and time. Mental status is at baseline.     GCS: GCS eye subscore is 4. GCS verbal subscore is 5. GCS motor subscore is 6.     Cranial Nerves: Cranial nerves are intact. No cranial nerve deficit.     Sensory: No sensory deficit.     Motor: Motor function is intact.  Psychiatric:        Attention and Perception: Attention normal.        Speech: Speech normal.        Behavior: Behavior normal.    ED Results / Procedures / Treatments   Labs (all labs ordered are listed, but only abnormal results are displayed) Labs Reviewed  CBC  BASIC METABOLIC PANEL    EKG EKG Interpretation  Date/Time:  Tuesday July 26 2020 15:13:48 EDT Ventricular Rate:  68 PR Interval:  163 QRS Duration: 88 QT Interval:  410 QTC Calculation: 436 R Axis:   61 Text Interpretation: Sinus rhythm Borderline repolarization abnormality No significant change since last tracing Confirmed by Lorre Nick ([id number]) on 07/26/2020 3:30:17 PM  Radiology No results found.  Procedures Procedures   Medications Ordered in ED Medications  hydrOXYzine (ATARAX/VISTARIL) tablet 25 mg (has no administration in time range)    ED Course  I have reviewed the triage vital signs and the nursing notes.  Pertinent labs & imaging results that were available during my care of the patient were reviewed by me and considered in my medical decision making (see chart for details).    MDM Rules/Calculators/A&P                          For troponin here negative.  Given Vistaril and does feel better.  Suspect that anxiety is related to her symptoms and low suspicion for ACS.  Chest x-ray without acute findings.  Will discharge home Final Clinical Impression(s) / ED Diagnoses Final diagnoses:  Chest pain    Rx / DC Orders ED Discharge Orders     None        Lorre Nick, MD 07/26/20 1800

## 2020-08-09 ENCOUNTER — Encounter: Payer: Self-pay | Admitting: Nurse Practitioner

## 2020-08-09 ENCOUNTER — Ambulatory Visit: Payer: 59 | Attending: Nurse Practitioner | Admitting: Nurse Practitioner

## 2020-08-09 ENCOUNTER — Other Ambulatory Visit: Payer: Self-pay

## 2020-08-09 DIAGNOSIS — F419 Anxiety disorder, unspecified: Secondary | ICD-10-CM | POA: Diagnosis not present

## 2020-08-09 DIAGNOSIS — E785 Hyperlipidemia, unspecified: Secondary | ICD-10-CM | POA: Diagnosis not present

## 2020-08-09 DIAGNOSIS — R002 Palpitations: Secondary | ICD-10-CM | POA: Diagnosis not present

## 2020-08-09 DIAGNOSIS — Z1211 Encounter for screening for malignant neoplasm of colon: Secondary | ICD-10-CM

## 2020-08-09 DIAGNOSIS — I10 Essential (primary) hypertension: Secondary | ICD-10-CM

## 2020-08-09 DIAGNOSIS — Z7689 Persons encountering health services in other specified circumstances: Secondary | ICD-10-CM

## 2020-08-09 MED ORDER — LOSARTAN POTASSIUM 25 MG PO TABS: 25.0000 mg | ORAL_TABLET | Freq: Every day | ORAL | 1 refills | Status: DC

## 2020-08-09 MED ORDER — HYDROXYZINE HCL 25 MG PO TABS: 25.0000 mg | ORAL_TABLET | Freq: Three times a day (TID) | ORAL | 3 refills | Status: AC | PRN

## 2020-08-09 MED ORDER — ATORVASTATIN CALCIUM 20 MG PO TABS: 20.0000 mg | ORAL_TABLET | Freq: Every day | ORAL | 3 refills | Status: DC

## 2020-08-09 NOTE — Progress Notes (Signed)
Virtual Visit via Telephone Note Due to national recommendations of social distancing due to COVID 19, telehealth visit is felt to be most appropriate for this patient at this time.  I discussed the limitations, risks, security and privacy concerns of performing an evaluation and management service by telephone and the availability of in person appointments. I also discussed with the patient that there may be a patient responsible charge related to this service. The patient expressed understanding and agreed to proceed.    I connected with Armen Pickup on 08/09/20  at   8:50 AM EDT  EDT by telephone and verified that I am speaking with the correct person using two identifiers.  Location of Patient: Private Residence   Location of Provider: [facility]    Persons participating in Telemedicine visit: Bertram Denver FNP-BC GLOVFIEP Kuzel    History of Present Illness: Telemedicine visit for: Establish Care She  has a past medical history of Anxiety, GERD Hypertension, and SVT (10/20/2019).    She has multiple ED visits for chest pain and symptoms of GERD over the past 6 months. She was started on hydroxyzine last month for anxiety. EKG was normal at that time.  She does have a history of SVT which converted to NSR after 1 dose of adenosine by EMS on 10-18-2019. Due to elevated troponin at that time Cardiology was consulted. 2d Echo and CTA were Normal.  Per Cardiology: I talked to her about different vagal maneuver techniques to abort episodes. I will be happy to see her in follow-up (we will try to arrange).  We can discuss options of vagal maneuvers and possible pill in the pocket techniques etc to treat her SVT. Today she states she did not follow up with Cardiology as she was uninsured at that time. She was also started on losartan at that time for HTN.   Today she endorses daily intermittent palpitations.  Denies chest pain. Palpitations resolve on  their own. With history of SVT will refer to Cardiology. She is a current smoker.    Anxiety Symptoms include nervousness. Feeling shaky inside.   Also recently purchased a menopause medication over the counter to help with night sweats and irritability. Wants to know if it will interfere with her currently prescribed medications.       Last mammogram 2 years ago last pap smear she ca not recall    BP Readings from Last 3 Encounters:  07/26/20 (!) 149/79  06/15/20 128/69  04/13/20 138/78     Past Medical History:  Diagnosis Date   Anxiety    GERD (gastroesophageal reflux disease)    Hypertension    SVT (supraventricular tachycardia) (HCC) 10/20/2019    Past Surgical History:  Procedure Laterality Date   CESAREAN SECTION     CESAREAN SECTION      Family History  Problem Relation Age of Onset   CAD Mother     Social History   Socioeconomic History   Marital status: Single    Spouse name: Not on file   Number of children: Not on file   Years of education: Not on file   Highest education level: Not on file  Occupational History   Not on file  Tobacco Use   Smoking status: Every Day    Packs/day: 0.25    Types: Cigarettes   Smokeless tobacco: Never  Vaping Use   Vaping Use: Never used  Substance and Sexual Activity   Alcohol use: Not Currently   Drug use:  Not Currently   Sexual activity: Not on file  Other Topics Concern   Not on file  Social History Narrative   Not on file   Social Determinants of Health   Financial Resource Strain: Not on file  Food Insecurity: Not on file  Transportation Needs: Not on file  Physical Activity: Not on file  Stress: Not on file  Social Connections: Not on file     Observations/Objective: Awake, alert and oriented x 3   Review of Systems  Constitutional:  Negative for fever, malaise/fatigue and weight loss.  HENT: Negative.  Negative for nosebleeds.   Eyes: Negative.  Negative for blurred vision, double vision  and photophobia.  Respiratory: Negative.  Negative for cough and shortness of breath.   Cardiovascular:  Positive for palpitations. Negative for chest pain and leg swelling.  Gastrointestinal: Negative.  Negative for heartburn, nausea and vomiting.  Musculoskeletal: Negative.  Negative for myalgias.  Neurological: Negative.  Negative for dizziness, focal weakness, seizures and headaches.  Psychiatric/Behavioral:  Negative for suicidal ideas. The patient is nervous/anxious.    Assessment and Plan: Diagnoses and all orders for this visit:  Encounter to establish care  Anxiety -     hydrOXYzine (ATARAX/VISTARIL) 25 MG tablet; Take 1 tablet (25 mg total) by mouth every 8 (eight) hours as needed.  Heart palpitations -     Cancel: Thyroid Panel With TSH -     Ambulatory referral to Cardiology -     Thyroid Panel With TSH; Future  Colon cancer screening -     Ambulatory referral to Gastroenterology  Dyslipidemia, goal LDL below 100 -     atorvastatin (LIPITOR) 20 MG tablet; Take 1 tablet (20 mg total) by mouth daily. -     Lipid panel; Future INSTRUCTIONS: Work on a low fat, heart healthy diet and participate in regular aerobic exercise program by working out at least 150 minutes per week; 5 days a week-30 minutes per day. Avoid red meat/beef/steak,  fried foods. junk foods, sodas, sugary drinks, unhealthy snacking, alcohol and smoking.  Drink at least 80 oz of water per day and monitor your carbohydrate intake daily.    Primary hypertension -     losartan (COZAAR) 25 MG tablet; Take 1 tablet (25 mg total) by mouth daily. Smoking cessation  Continue all antihypertensives as prescribed.  Remember to bring in your blood pressure log with you for your follow up appointment.  DASH/Mediterranean Diets are healthier choices for HTN.      Follow Up Instructions Return for PAP SMEAR.     I discussed the assessment and treatment plan with the patient. The patient was provided an opportunity  to ask questions and all were answered. The patient agreed with the plan and demonstrated an understanding of the instructions.   The patient was advised to call back or seek an in-person evaluation if the symptoms worsen or if the condition fails to improve as anticipated.  I provided 14 minutes of non-face-to-face time during this encounter including median intraservice time, reviewing previous notes, labs, imaging, medications and explaining diagnosis and management.  Claiborne Rigg, FNP-BC

## 2020-08-17 NOTE — Progress Notes (Deleted)
Cardiology Office Note:    Date:  08/17/2020   ID:  Joyce Peters, Joyce Peters [birth year], MRN [id number]  PCP:  Joyce Rigg, NP   [facility] Providers Cardiologist:  Joyce Lemma, MD { Click to update primary MD,subspecialty MD or APP then REFRESH:1}    Referring MD: Joyce Rigg, NP   CC: *** Consulted for the evaluation of palpitations at the behest of Joyce Peters, Joyce W, NP  New Birthday  History of Present Illness:    Joyce Peters is a 50 y.o. female with a hx of SVT, HLD, HTN, Contrast Dye allergy, smoking who presents for evaluation 08/18/20.  Patient notes that she is feeling ***.  Has had no chest pain, chest pressure, chest tightness, chest stinging ***.  Discomfort occurs with ***, worsens with ***, and improves with ***.  Patient exertion notable for *** with *** and feels no symptoms.  No shortness of breath, DOE ***.  No PND or orthopnea***.  No weight gain***, leg swelling ***, or abdominal swelling***.  No syncope or near syncope ***. Notes *** no palpitations or funny heart beats.   No leg claudication.  Recent ED evaluation 07/26/20.  No red flag findings.  Patient reports prior cardiac testing including *** echo, *** stress test, *** heart catheterizations, *** cardioversion, *** ablations.  No history of ***pre-eclampsia, gestation HTN or gestational DM.  No Fen-Phen or drug use***.  Ambulatory BP ***.   Past Medical History:  Diagnosis Date   Anxiety    GERD (gastroesophageal reflux disease)    Hypertension    SVT (supraventricular tachycardia) (HCC) 10/20/2019    Past Surgical History:  Procedure Laterality Date   CESAREAN SECTION     CESAREAN SECTION      Current Medications: No outpatient medications have been marked as taking for the 08/18/20 encounter (Appointment) with Joyce Constant, MD.     Allergies:   Shellfish allergy, Contrast media [iodinated diagnostic agents], and Lactose intolerance (gi)   Social History    Socioeconomic History   Marital status: Single    Spouse name: Not on file   Number of children: Not on file   Years of education: Not on file   Highest education level: Not on file  Occupational History   Not on file  Tobacco Use   Smoking status: Every Day    Packs/day: 0.25    Types: Cigarettes   Smokeless tobacco: Never  Vaping Use   Vaping Use: Never used  Substance and Sexual Activity   Alcohol use: Not Currently   Drug use: Not Currently   Sexual activity: Not on file  Other Topics Concern   Not on file  Social History Narrative   Not on file   Social Determinants of Health   Financial Resource Strain: Not on file  Food Insecurity: Not on file  Transportation Needs: Not on file  Physical Activity: Not on file  Stress: Not on file  Social Connections: Not on file     Family History: The patient's ***family history includes CAD in her mother.  ROS:   Please see the history of present illness.    *** All other systems reviewed and are negative.  EKGs/Labs/Other Studies Reviewed:    The following studies were reviewed today: ***  EKG:  EKG is *** ordered today.  The ekg ordered today demonstrates ***  Cardiac Event Monitoring***: Date: Results:  Transthoracic Echocardiogram: Date: 10/20/19 Results: 1. Left ventricular ejection fraction, by estimation, is 60 to 65%. Left  ventricular ejection fraction by 2D MOD biplane is 64.5 %. The left  ventricle has normal function. The left ventricle has no regional wall  motion abnormalities. Left ventricular  diastolic parameters were normal.   2. Right ventricular systolic function is normal. The right ventricular  size is normal. There is normal pulmonary artery systolic pressure.   3. Left atrial size was mildly dilated.   4. The mitral valve is normal in structure. Trivial mitral valve  regurgitation. No evidence of mitral stenosis.   5. The aortic valve is tricuspid. Aortic valve regurgitation is not   visualized. No aortic stenosis is present.   6. The inferior vena cava is normal in size with greater than 50%  respiratory variability, suggesting right atrial pressure of 3 mmHg.  Cardiac CT: Date: 10/21/19 Results: IMPRESSION: 1. Coronary calcium score of 0. This was 0 percentile for age and sex matched control.   2. Normal coronary origin with right dominance.   3. No evidence of CAD.  4. Aortic Atherosclerosis  Recent Labs: 10/20/2019: Magnesium 1.9; TSH 0.884 04/13/2020: ALT 20 07/26/2020: BUN 11; Creatinine, Ser 0.81; Hemoglobin 12.8; Platelets 271; Potassium 3.7; Sodium 139  Recent Lipid Panel    Component Value Date/Time   CHOL 237 (H) 10/20/2019 0512   TRIG 73 10/20/2019 0512   HDL 44 10/20/2019 0512   CHOLHDL 5.4 10/20/2019 0512   VLDL 15 10/20/2019 0512   LDLCALC 178 (H) 10/20/2019 0512    Physical Exam:    VS:  There were no vitals taken for this visit.    Wt Readings from Last 3 Encounters:  07/26/20 200 lb (90.7 kg)  01/25/20 214 lb (97.1 kg)  10/20/19 217 lb 8 oz (98.7 kg)     GEN: *** Well nourished, well developed in no acute distress HEENT: Normal NECK: No JVD; No carotid bruits LYMPHATICS: No lymphadenopathy CARDIAC: ***RRR, no murmurs, rubs, gallops RESPIRATORY:  Clear to auscultation without rales, wheezing or rhonchi  ABDOMEN: Soft, non-tender, non-distended MUSCULOSKELETAL:  No edema; No deformity  SKIN: Warm and dry NEUROLOGIC:  Alert and oriented x 3 PSYCHIATRIC:  Normal affect   ASSESSMENT:    No diagnosis found. PLAN:    SVT NOS HTN Non Cardiac CP*** HLD and Aortic Atherosclerosis Tobacco Abuse- *** - ZioPatch   {Are you ordering a CV Procedure (e.g. stress test, cath, DCCV, TEE, etc)?   Press F2        :[id number]}    Medication Adjustments/Labs and Tests Ordered: Current medicines are reviewed at length with the patient today.  Concerns regarding medicines are outlined above.  No orders of the defined types were placed  in this encounter.  No orders of the defined types were placed in this encounter.   There are no Patient Instructions on file for this visit.   Signed, Joyce Constant, MD  08/17/2020 2:37 PM    [facility]

## 2020-08-18 ENCOUNTER — Ambulatory Visit: Payer: 59 | Admitting: Internal Medicine

## 2020-11-01 NOTE — Progress Notes (Deleted)
Cardiology Office Note:    Date:  11/01/2020   ID:  Joyce, Peters [birth year], MRN [id number]  PCP:  Claiborne Rigg, NP   [facility] Providers Cardiologist:  Bryan Lemma, MD { Click to update primary MD,subspecialty MD or APP then REFRESH:1}    Referring MD: Claiborne Rigg, NP   CC:  DOD CP, SVT  History of Present Illness:    Joyce Peters is a 50 y.o. female with a hx of P-SVT (10/20/2019) with negative coronary CT in 2021, HTN, and Anxiety.  Last seen 10/2019.  In interim of this visit, patient has had multiple admissions for chest pain and anxiety, last in 07/2020.  Seen 11/02/20.  Patient notes that she is doing ***.    No chest pain or pressure ***.  No SOB/DOE*** and no PND/Orthopnea***.  No weight gain or leg swelling***.  No palpitations or syncope ***.  Ambulatory blood pressure ***.    Past Medical History:  Diagnosis Date   Anxiety    GERD (gastroesophageal reflux disease)    Hypertension    SVT (supraventricular tachycardia) (HCC) 10/20/2019    Past Surgical History:  Procedure Laterality Date   CESAREAN SECTION     CESAREAN SECTION      Current Medications: No outpatient medications have been marked as taking for the 11/02/20 encounter (Appointment) with Christell Constant, MD.     Allergies:   Shellfish allergy, Contrast media [iodinated diagnostic agents], and Lactose intolerance (gi)   Social History   Socioeconomic History   Marital status: Single    Spouse name: Not on file   Number of children: Not on file   Years of education: Not on file   Highest education level: Not on file  Occupational History   Not on file  Tobacco Use   Smoking status: Every Day    Packs/day: 0.25    Types: Cigarettes   Smokeless tobacco: Never  Vaping Use   Vaping Use: Never used  Substance and Sexual Activity   Alcohol use: Not Currently   Drug use: Not Currently   Sexual activity: Not on file  Other Topics Concern   Not on file   Social History Narrative   Not on file   Social Determinants of Health   Financial Resource Strain: Not on file  Food Insecurity: Not on file  Transportation Needs: Not on file  Physical Activity: Not on file  Stress: Not on file  Social Connections: Not on file     Family History: The patient's family history includes CAD in her mother.  ROS:   Please see the history of present illness.     All other systems reviewed and are negative.  EKGs/Labs/Other Studies Reviewed:    The following studies were reviewed today:   EKG:  EKG is *** ordered today.  The ekg ordered today demonstrates 11/01/20: ***  Transthoracic Echocardiogram: Date: 10/19/20 Results:  1. Left ventricular ejection fraction, by estimation, is 60 to 65%. Left  ventricular ejection fraction by 2D MOD biplane is 64.5 %. The left  ventricle has normal function. The left ventricle has no regional wall  motion abnormalities. Left ventricular  diastolic parameters were normal.   2. Right ventricular systolic function is normal. The right ventricular  size is normal. There is normal pulmonary artery systolic pressure.   3. Left atrial size was mildly dilated.   4. The mitral valve is normal in structure. Trivial mitral valve  regurgitation. No evidence of mitral stenosis.  5. The aortic valve is tricuspid. Aortic valve regurgitation is not  visualized. No aortic stenosis is present.   6. The inferior vena cava is normal in size with greater than 50%  respiratory variability, suggesting right atrial pressure of 3 mmHg.   Cardiac CT: Date: 10/21/19 Results: Aortic Atherosclerosis no CAC IMPRESSION: 1. Coronary calcium score of 0. This was 0 percentile for age and sex matched control.   2. Normal coronary origin with right dominance.   3. No evidence of CAD.   Recent Labs: 04/13/2020: ALT 20 07/26/2020: BUN 11; Creatinine, Ser 0.81; Hemoglobin 12.8; Platelets 271; Potassium 3.7; Sodium 139  Recent Lipid  Panel    Component Value Date/Time   CHOL 237 (H) 10/20/2019 0512   TRIG 73 10/20/2019 0512   HDL 44 10/20/2019 0512   CHOLHDL 5.4 10/20/2019 0512   VLDL 15 10/20/2019 0512   LDLCALC 178 (H) 10/20/2019 0512       Physical Exam:    VS:  There were no vitals taken for this visit.    Wt Readings from Last 3 Encounters:  07/26/20 200 lb (90.7 kg)  01/25/20 214 lb (97.1 kg)  10/20/19 217 lb 8 oz (98.7 kg)     GEN: *** Well nourished, well developed in no acute distress HEENT: Normal NECK: No JVD; No carotid bruits LYMPHATICS: No lymphadenopathy CARDIAC: ***RRR, no murmurs, rubs, gallops RESPIRATORY:  Clear to auscultation without rales, wheezing or rhonchi  ABDOMEN: Soft, non-tender, non-distended MUSCULOSKELETAL:  No edema; No deformity  SKIN: Warm and dry NEUROLOGIC:  Alert and oriented x 3 PSYCHIATRIC:  Normal affect   ASSESSMENT:    No diagnosis found. PLAN:    PSVT HTN - losartan 25  Aortic Atherosclerosis - on atorvastatin 20  {Are you ordering a CV Procedure (e.g. stress test, cath, DCCV, TEE, etc)?   Press F2        :[id number]}    Medication Adjustments/Labs and Tests Ordered: Current medicines are reviewed at length with the patient today.  Concerns regarding medicines are outlined above.  No orders of the defined types were placed in this encounter.  No orders of the defined types were placed in this encounter.   There are no Patient Instructions on file for this visit.   Signed, Christell Constant, MD  11/01/2020 2:10 PM    [facility]

## 2020-11-02 ENCOUNTER — Ambulatory Visit: Payer: 59 | Admitting: Internal Medicine

## 2021-01-11 ENCOUNTER — Encounter: Payer: Self-pay | Admitting: Emergency Medicine

## 2021-01-11 ENCOUNTER — Other Ambulatory Visit: Payer: Self-pay

## 2021-01-11 ENCOUNTER — Ambulatory Visit
Admission: EM | Admit: 2021-01-11 | Discharge: 2021-01-11 | Disposition: A | Discharge status: Home / Self Care | Payer: 59

## 2021-01-11 DIAGNOSIS — F411 Generalized anxiety disorder: Secondary | ICD-10-CM

## 2021-01-11 NOTE — ED Triage Notes (Signed)
States her anxiety is acting up, has been taking hydroxyzine without improvement. Denies suicidal/homicidal thoughts

## 2021-01-11 NOTE — ED Provider Notes (Signed)
Patient here today evaluation of anxiety. Recommended further evaluation at behavioral health urgent care. Patient is currently not a harm to herself or others-- daughter is here with her today for transport.    Tomi Bamberger, PA-C 01/11/21 1057

## 2021-01-11 NOTE — Discharge Instructions (Signed)
° °  Please report to [facility] for further evaluation and treatment.

## 2021-02-20 ENCOUNTER — Encounter: Payer: Self-pay | Admitting: Emergency Medicine

## 2021-02-20 ENCOUNTER — Ambulatory Visit
Admission: EM | Admit: 2021-02-20 | Discharge: 2021-02-20 | Disposition: A | Discharge status: Home / Self Care | Payer: 59 | Attending: Internal Medicine | Admitting: Internal Medicine

## 2021-02-20 ENCOUNTER — Other Ambulatory Visit: Payer: Self-pay

## 2021-02-20 DIAGNOSIS — J069 Acute upper respiratory infection, unspecified: Secondary | ICD-10-CM | POA: Diagnosis not present

## 2021-02-20 MED ORDER — FLUTICASONE PROPIONATE 50 MCG/ACT NA SUSP: 1.0000 | Freq: Every day | NASAL | 0 refills | Status: DC

## 2021-02-20 MED ORDER — BENZONATATE 100 MG PO CAPS: 100.0000 mg | ORAL_CAPSULE | Freq: Three times a day (TID) | ORAL | 0 refills | Status: DC | PRN

## 2021-02-20 NOTE — ED Triage Notes (Signed)
Patient states she was sick Thursday, feels better, still some body aches and chills, exposed to [place].

## 2021-02-20 NOTE — Discharge Instructions (Signed)
It appears that you have a viral upper respiratory infection that should self resolve in the next few days with symptomatic treatment.  You have been prescribed 2 medications to take for symptoms.  COVID-19 and flu test is pending.  We will call if it is positive.

## 2021-02-20 NOTE — ED Provider Notes (Signed)
[name]    CSN: [id number] Arrival date & time: 02/20/21  1105      History   Chief Complaint Chief Complaint  Patient presents with   Body Aches/Chills    HPI Joyce Peters is a 51 y.o. female.   Patient presents with nasal congestion, nonproductive cough, body aches, chills that started approximately 4 days ago.  Patient reports that she was exposed to COVID-19 at her workplace.  Patient does not report taking any medications to alleviate symptoms.  Denies chest pain, shortness of breath, nausea, vomiting, diarrhea, abdominal pain, sore throat, ear pain.    Past Medical History:  Diagnosis Date   Anxiety    GERD (gastroesophageal reflux disease)    Hypertension    SVT (supraventricular tachycardia) (HCC) 10/20/2019    Patient Active Problem List   Diagnosis Date Noted   SVT (supraventricular tachycardia) (HCC) 10/20/2019   Elevated troponin 10/20/2019   Chest pain 10/20/2019    Past Surgical History:  Procedure Laterality Date   CESAREAN SECTION     CESAREAN SECTION      OB History   No obstetric history on file.      Home Medications    Prior to Admission medications   Medication Sig Start Date End Date Taking? Authorizing Provider  acetaminophen (TYLENOL) 500 MG tablet Take 500 mg by mouth every 6 (six) hours as needed for mild pain.   Yes [provider]  benzonatate (TESSALON) 100 MG capsule Take 1 capsule (100 mg total) by mouth every 8 (eight) hours as needed for cough. 02/20/21  Yes Laqueshia Cihlar, Rolly Salter E, FNP  fluticasone (FLONASE) 50 MCG/ACT nasal spray Place 1 spray into both nostrils daily for 3 days. 02/20/21 02/23/21 Yes Marcayla Budge, Rolly Salter E, FNP  omeprazole (PRILOSEC) 20 MG capsule TAKE 1 CAPSULE (20 MG TOTAL) BY MOUTH DAILY FOR 28 DAYS. 06/15/20 06/15/21 Yes Placido Sou, PA-C  atorvastatin (LIPITOR) 20 MG tablet Take 1 tablet (20 mg total) by mouth daily. 08/09/20 11/07/20  Claiborne Rigg, NP  losartan (COZAAR) 25 MG tablet Take 1  tablet (25 mg total) by mouth daily. 08/09/20 11/07/20  Claiborne Rigg, NP    Family History Family History  Problem Relation Age of Onset   CAD Mother     Social History Social History   Tobacco Use   Smoking status: Every Day    Packs/day: 0.25    Types: Cigarettes   Smokeless tobacco: Never  Vaping Use   Vaping Use: Never used  Substance Use Topics   Alcohol use: Not Currently   Drug use: Not Currently     Allergies   Shellfish allergy, Contrast media [iodinated contrast media], and Lactose intolerance (gi)   Review of Systems Review of Systems Per HPI  Physical Exam Triage Vital Signs ED Triage Vitals  Enc Vitals Group     BP 02/20/21 1346 119/82     Pulse Rate 02/20/21 1346 78     Resp 02/20/21 1346 18     Temp 02/20/21 1346 98.7 F (37.1 C)     Temp Source 02/20/21 1346 Oral     SpO2 02/20/21 1346 99 %     Weight 02/20/21 1348 210 lb (95.3 kg)     Height 02/20/21 1348 5\' 7"  (1.702 m)     Head Circumference --      Peak Flow --      Pain Score 02/20/21 1348 6     Pain Loc --      Pain  Edu? --      Excl. in GC? --    No data found.  Updated Vital Signs BP 119/82 (BP Location: Right Arm)    Pulse 78    Temp 98.7 F (37.1 C) (Oral)    Resp 18    Ht 5\' 7"  (1.702 m)    Wt 210 lb (95.3 kg)    SpO2 99%    BMI 32.89 kg/m   Visual Acuity Right Eye Distance:   Left Eye Distance:   Bilateral Distance:    Right Eye Near:   Left Eye Near:    Bilateral Near:     Physical Exam Constitutional:      General: She is not in acute distress.    Appearance: Normal appearance. She is not toxic-appearing.  HENT:     Head: Normocephalic and atraumatic.     Right Ear: Tympanic membrane and ear canal normal.     Left Ear: Tympanic membrane and ear canal normal.     Nose: Congestion present.     Mouth/Throat:     Mouth: Mucous membranes are moist.     Pharynx: No posterior oropharyngeal erythema.  Eyes:     Extraocular Movements: Extraocular movements  intact.     Conjunctiva/sclera: Conjunctivae normal.     Pupils: Pupils are equal, round, and reactive to light.  Cardiovascular:     Rate and Rhythm: Normal rate and regular rhythm.     Pulses: Normal pulses.     Heart sounds: Normal heart sounds.  Pulmonary:     Effort: Pulmonary effort is normal. No respiratory distress.     Breath sounds: Normal breath sounds. No stridor. No wheezing, rhonchi or rales.  Abdominal:     General: Abdomen is flat. Bowel sounds are normal.     Palpations: Abdomen is soft.  Musculoskeletal:        General: Normal range of motion.     Cervical back: Normal range of motion.  Skin:    General: Skin is warm and dry.  Neurological:     General: No focal deficit present.     Mental Status: She is alert and oriented to person, place, and time. Mental status is at baseline.  Psychiatric:        Mood and Affect: Mood normal.        Behavior: Behavior normal.     UC Treatments / Results  Labs (all labs ordered are listed, but only abnormal results are displayed) Labs Reviewed  COVID-19, FLU A+B NAA    EKG   Radiology No results found.  Procedures Procedures (including critical care time)  Medications Ordered in UC Medications - No data to display  Initial Impression / Assessment and Plan / UC Course  I have reviewed the triage vital signs and the nursing notes.  Pertinent labs & imaging results that were available during my care of the patient were reviewed by me and considered in my medical decision making (see chart for details).     Patient presents with symptoms likely from a viral upper respiratory infection. Differential includes bacterial pneumonia, sinusitis, allergic rhinitis, COVID-19, flu. Do not suspect underlying cardiopulmonary process. Symptoms seem unlikely related to ACS, CHF or COPD exacerbations, pneumonia, pneumothorax. Patient is nontoxic appearing and not in need of emergent medical intervention.  Highly suspicious of  COVID-19 given patient's close exposure.  COVID-19 and flu test pending.  Recommended symptom control with over the counter medications.  Patient sent prescriptions.  Return if symptoms fail to  improve in 1-2 weeks or you develop shortness of breath, chest pain, severe headache. Patient states understanding and is agreeable.  Discharged with PCP followup.  Final Clinical Impressions(s) / UC Diagnoses   Final diagnoses:  Viral upper respiratory tract infection with cough     Discharge Instructions      It appears that you have a viral upper respiratory infection that should self resolve in the next few days with symptomatic treatment.  You have been prescribed 2 medications to take for symptoms.  COVID-19 and flu test is pending.  We will call if it is positive.    ED Prescriptions     Medication Sig Dispense Auth. Provider   benzonatate (TESSALON) 100 MG capsule Take 1 capsule (100 mg total) by mouth every 8 (eight) hours as needed for cough. 21 capsule [place], [place] E, [place]   fluticasone [facility]) 50 MCG/ACT nasal spray Place 1 spray into both nostrils daily for 3 days. 16 g Gustavus Bryant, [place]      PDMP not reviewed this encounter.   Gustavus Bryant, [place] 02/20/21 [phone]

## 2021-02-21 LAB — COVID-19, FLU A+B NAA
Influenza A, NAA: NOT DETECTED
Influenza B, NAA: NOT DETECTED
SARS-CoV-2, NAA: DETECTED — AB

## 2021-03-10 IMAGING — CR DG CHEST 2V
2 series · 2 of 2 positions shown · non-contrast
Comparison: October 2019

CLINICAL DATA: Chest pain

EXAM:
CHEST - 2 VIEW

[chest pa]
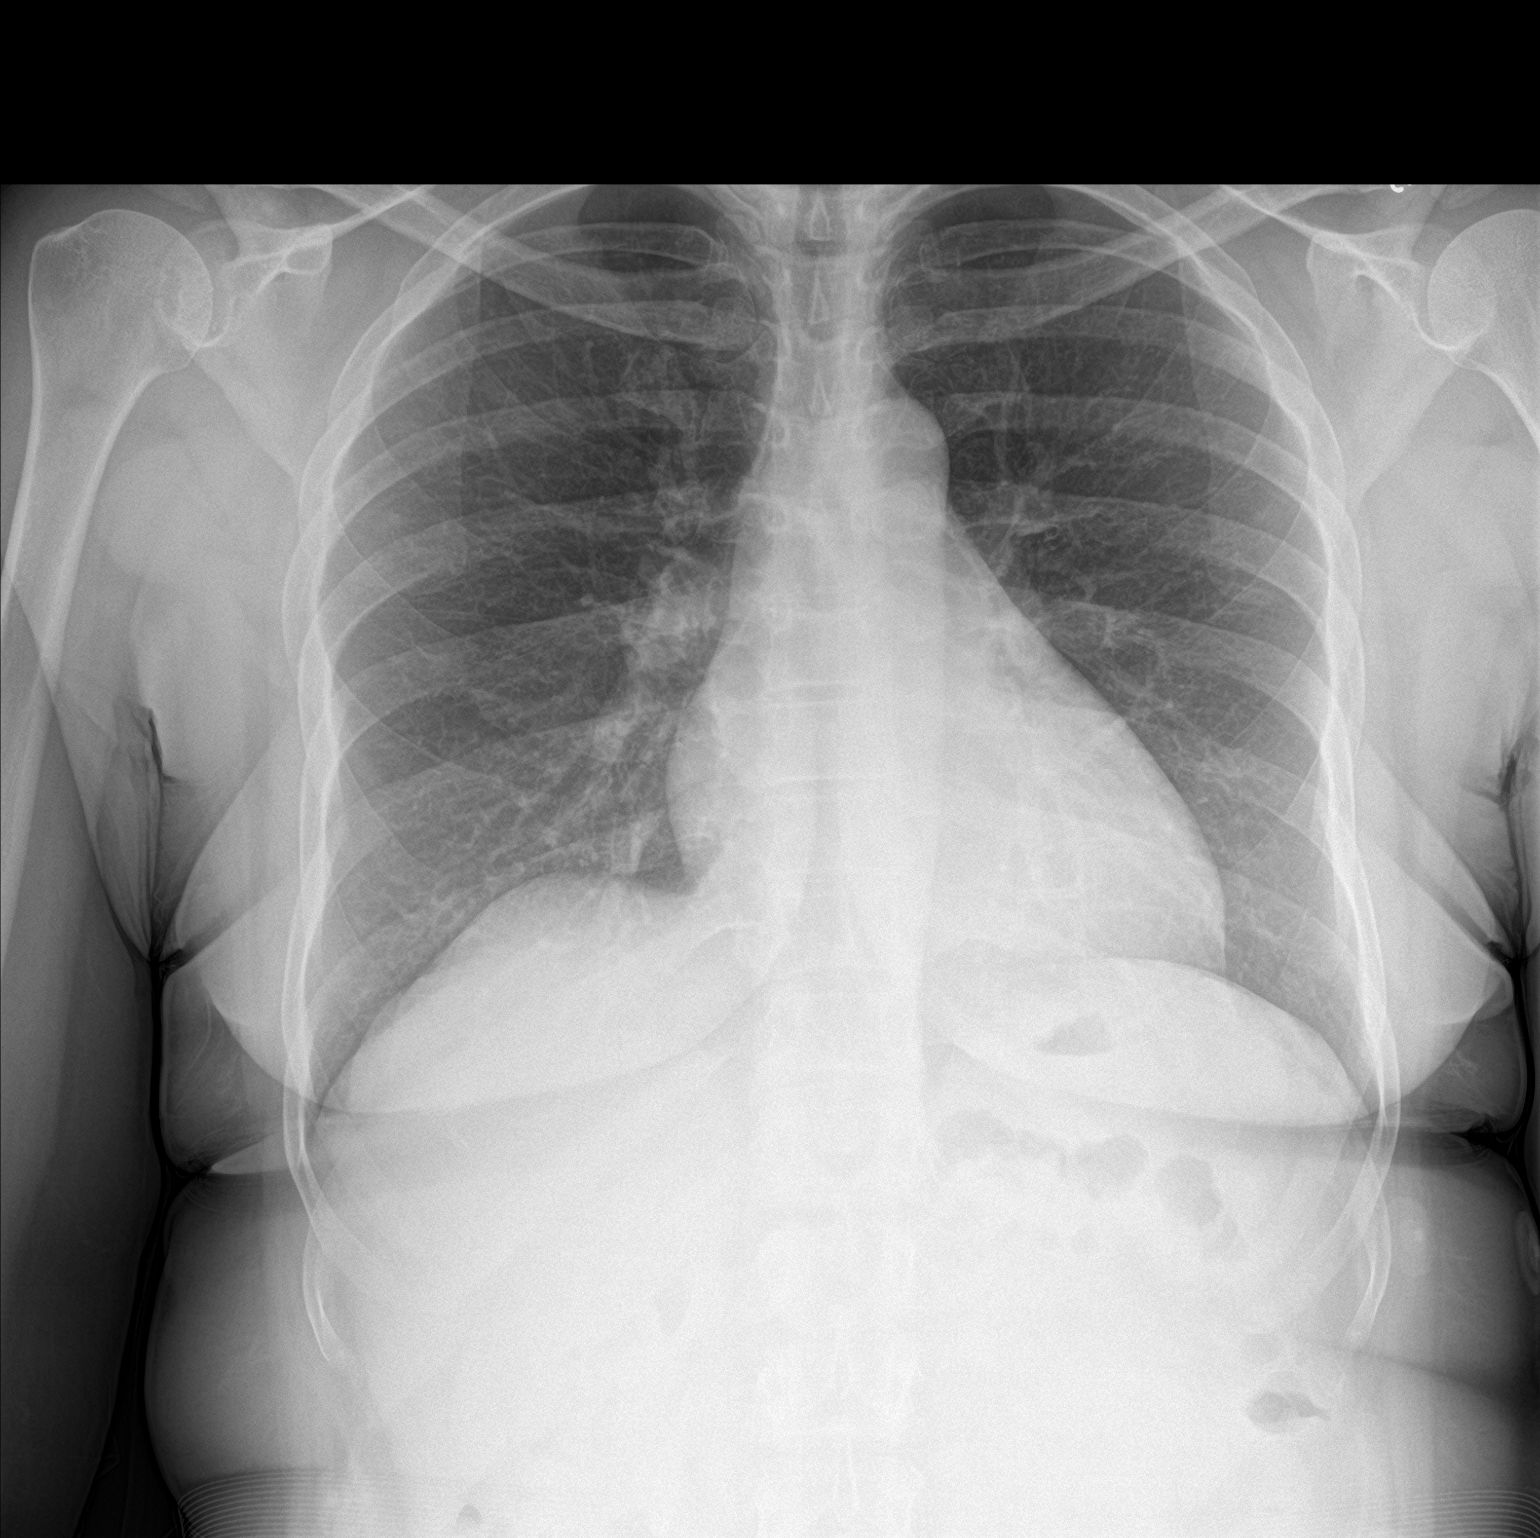

[chest lat]
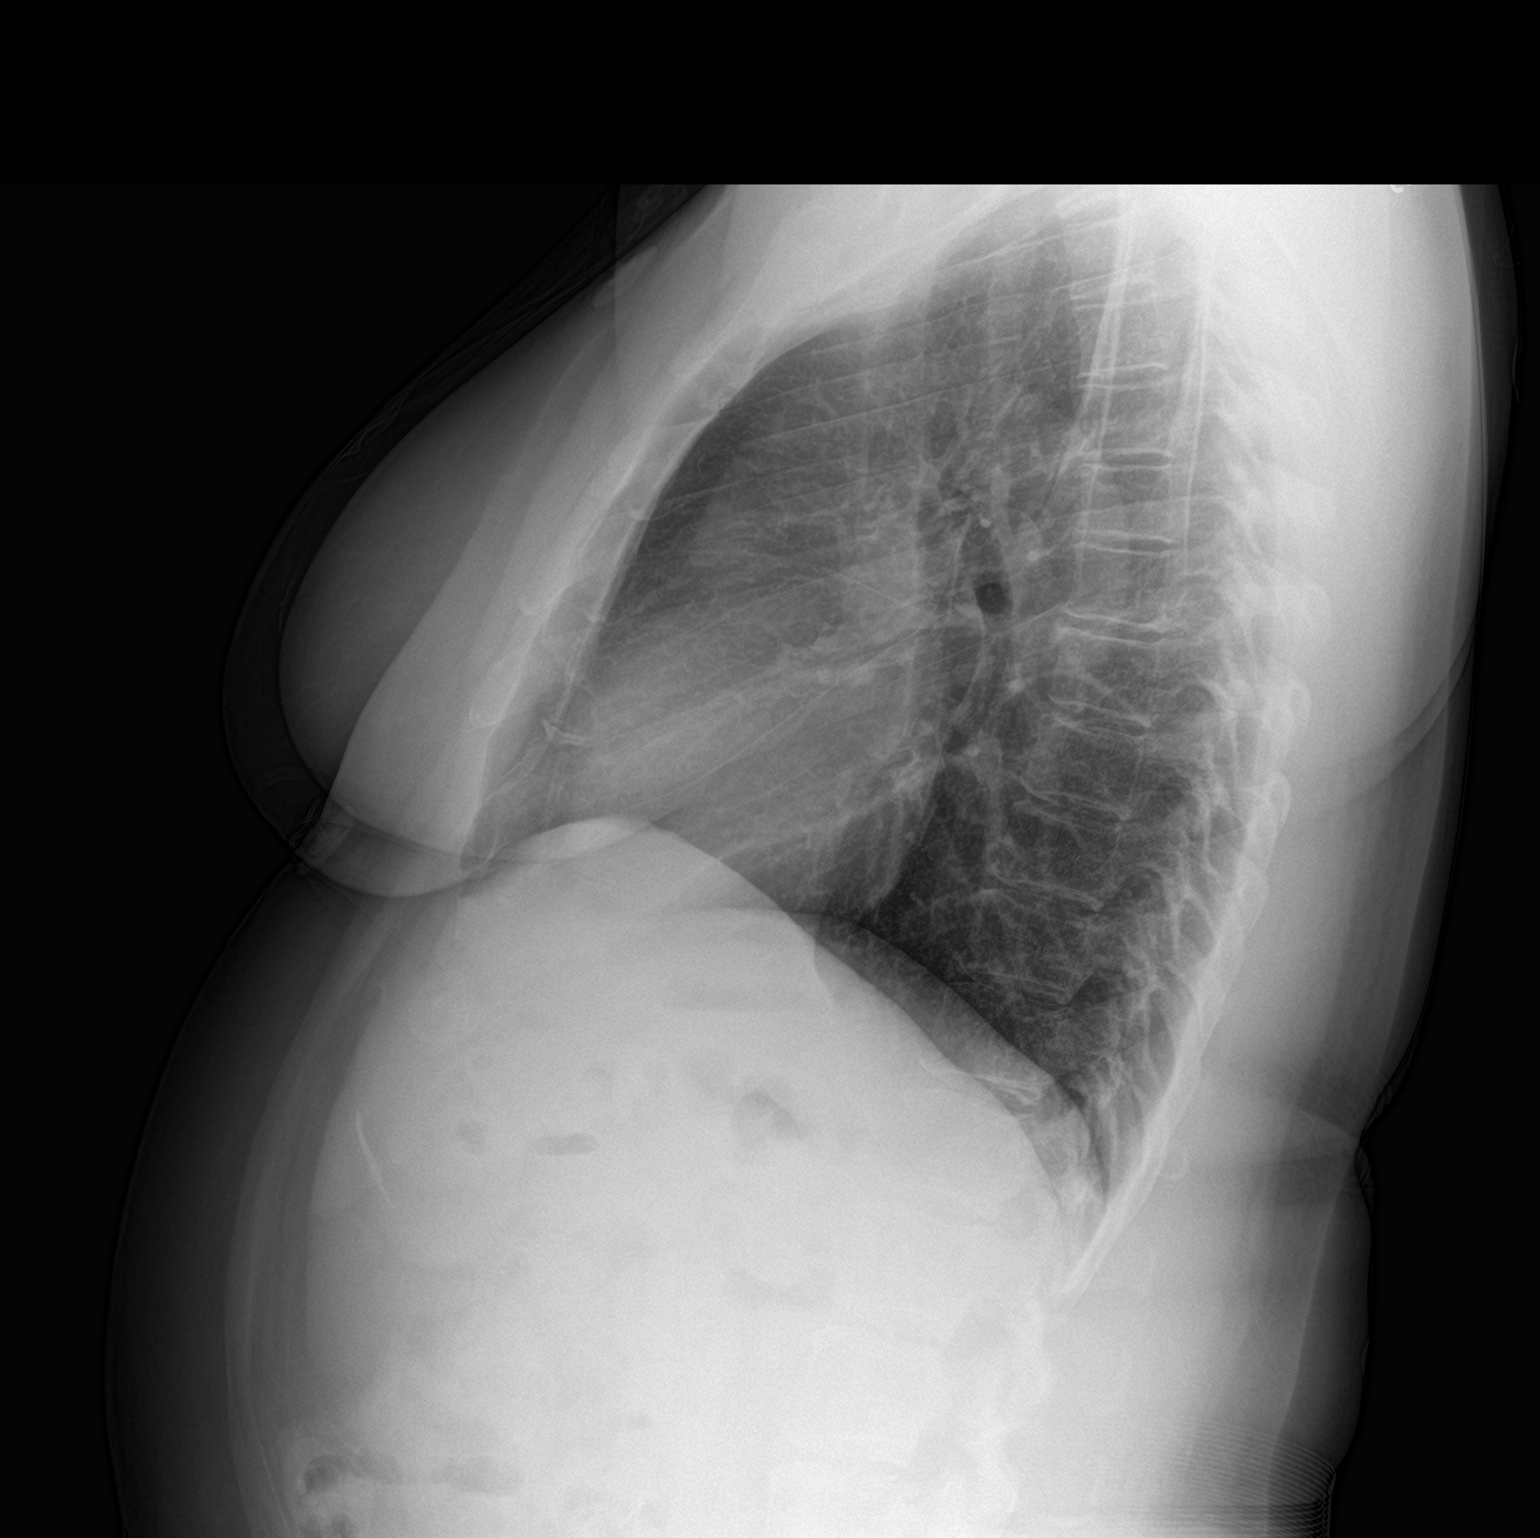

[2 of 2 positions shown; findings below may reference images not displayed]

FINDINGS: The heart size and mediastinal contours are within normal limits.
Both lungs are clear. No pleural effusion or pneumothorax. The
visualized skeletal structures are unremarkable.
IMPRESSION: No acute process in the chest.

## 2021-05-27 IMAGING — DX DG CHEST 1V PORT
1 series · 1 of 1 positions shown · non-contrast
Comparison: 03/02/2020.

CLINICAL DATA: Chest pain.

EXAM:
PORTABLE CHEST 1 VIEW

[chest ap]
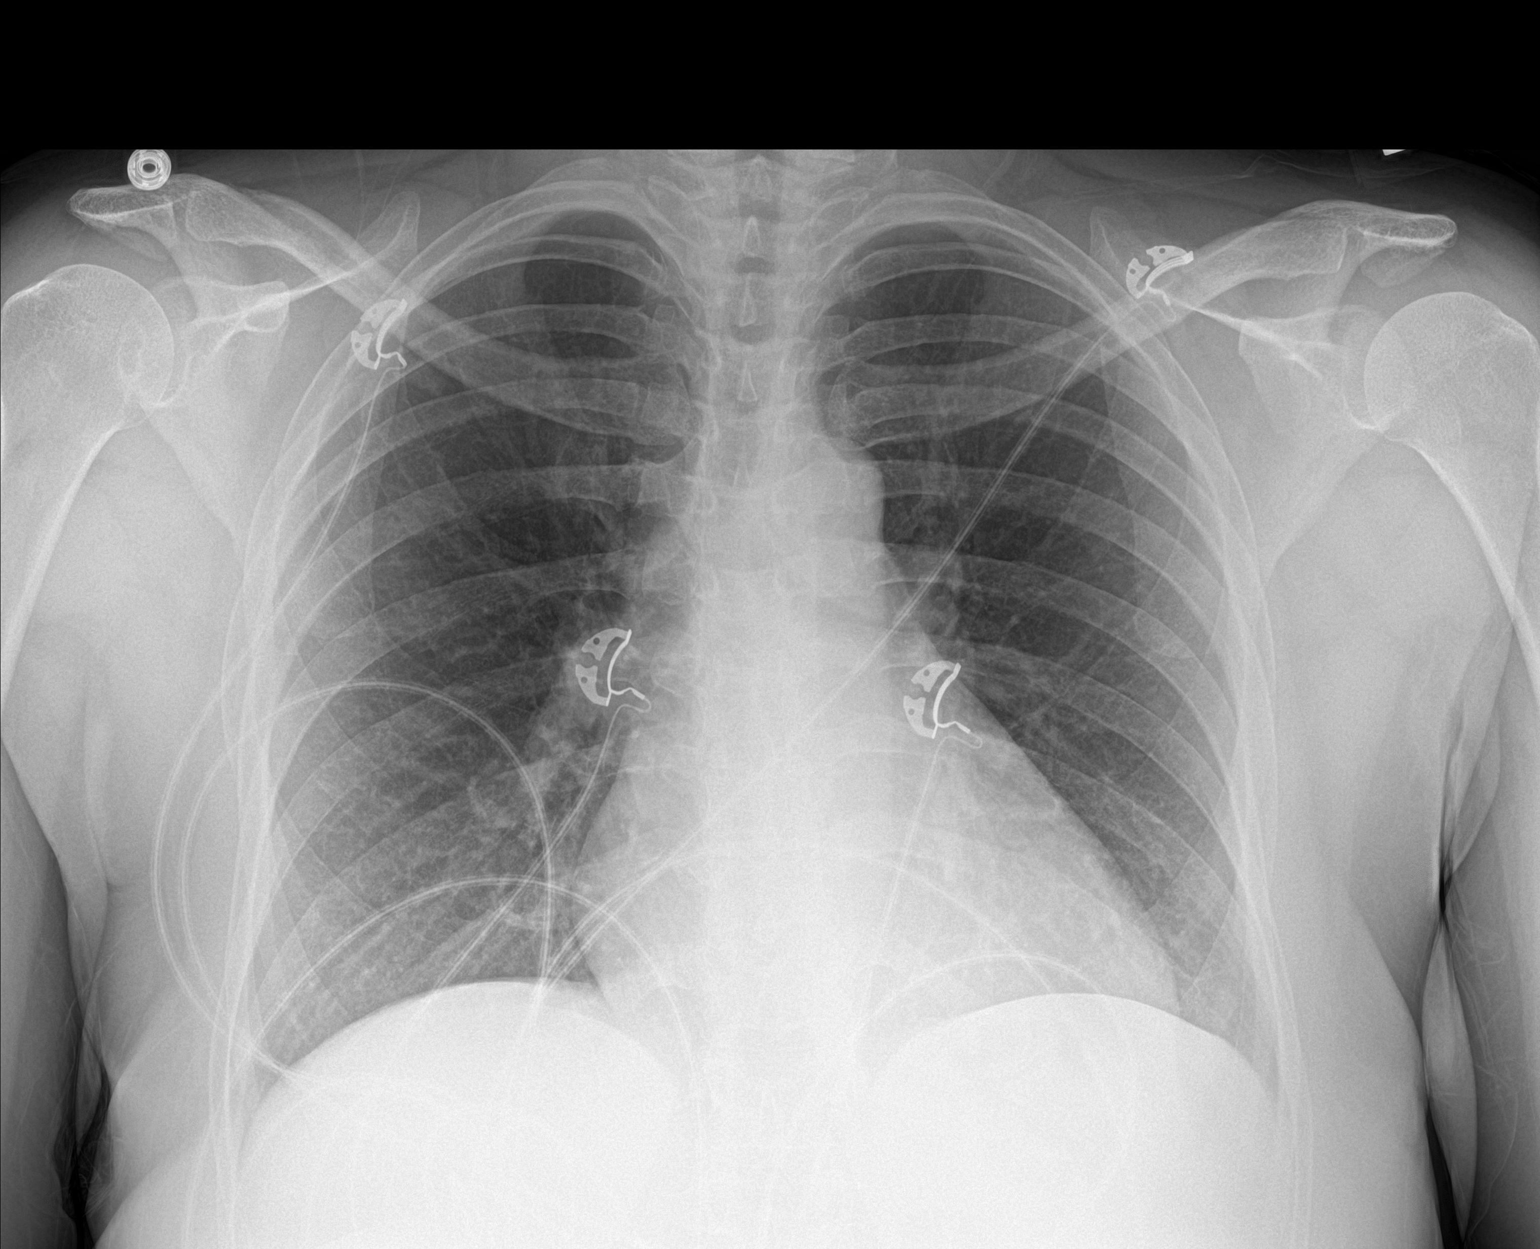

[1 of 1 positions shown; findings below may reference images not displayed]

FINDINGS: Mediastinum and hilar structures normal. Heart size normal. No focal
infiltrate. No pleural effusion or pneumothorax. Degenerative
changes scoliosis thoracic spine.
IMPRESSION: No acute cardiopulmonary disease.

## 2021-11-24 ENCOUNTER — Ambulatory Visit
Admission: EM | Admit: 2021-11-24 | Discharge: 2021-11-24 | Disposition: A | Discharge status: Home / Self Care | Payer: Commercial Managed Care - HMO | Attending: Physician Assistant | Admitting: Physician Assistant

## 2021-11-24 DIAGNOSIS — Z1152 Encounter for screening for COVID-19: Secondary | ICD-10-CM | POA: Insufficient documentation

## 2021-11-24 DIAGNOSIS — J069 Acute upper respiratory infection, unspecified: Secondary | ICD-10-CM | POA: Insufficient documentation

## 2021-11-24 DIAGNOSIS — H65193 Other acute nonsuppurative otitis media, bilateral: Secondary | ICD-10-CM | POA: Diagnosis present

## 2021-11-24 LAB — RESP PANEL BY RT-PCR (FLU A&B, COVID) ARPGX2
Influenza A by PCR: NEGATIVE
Influenza B by PCR: NEGATIVE
SARS Coronavirus 2 by RT PCR: NEGATIVE

## 2021-11-24 MED ORDER — AMOXICILLIN-POT CLAVULANATE 875-125 MG PO TABS: 1.0000 | ORAL_TABLET | Freq: Two times a day (BID) | ORAL | 0 refills | Status: DC

## 2021-11-24 NOTE — ED Triage Notes (Signed)
Pt presents to [facility] with co of otalgia, cough, congestion, ha since Monday has been taking otc cold and cough medication. At home Covid test neg.

## 2021-11-24 NOTE — ED Provider Notes (Signed)
[name]    CSN: [id number] Arrival date & time: 11/24/21  1031      History   Chief Complaint Chief Complaint  Patient presents with   Headache   Fever   Otalgia   Cough    HPI Joyce Peters is a 51 y.o. female.   Patient here today for evaluation of ear pain, cough, congestion and headache that she has had for 5 days.  She reports she has been taking over-the-counter cold and cough medication without significant improvement.  She did take a COVID test at home that was negative.  She denies any nausea, vomiting or diarrhea.  The history is provided by the patient.  Headache Associated symptoms: congestion, cough and ear pain   Associated symptoms: no abdominal pain, no diarrhea, no fever, no nausea, no sore throat and no vomiting   Fever Associated symptoms: congestion, cough, ear pain and headaches   Associated symptoms: no chills, no diarrhea, no nausea, no sore throat and no vomiting   Otalgia Associated symptoms: congestion, cough and headaches   Associated symptoms: no abdominal pain, no diarrhea, no fever, no sore throat and no vomiting   Cough Associated symptoms: ear pain and headaches   Associated symptoms: no chills, no eye discharge, no fever, no shortness of breath, no sore throat and no wheezing     Past Medical History:  Diagnosis Date   Anxiety    GERD (gastroesophageal reflux disease)    Hypertension    SVT (supraventricular tachycardia) 10/20/2019    Patient Active Problem List   Diagnosis Date Noted   SVT (supraventricular tachycardia) 10/20/2019   Elevated troponin 10/20/2019   Chest pain 10/20/2019    Past Surgical History:  Procedure Laterality Date   CESAREAN SECTION     CESAREAN SECTION      OB History   No obstetric history on file.      Home Medications    Prior to Admission medications   Medication Sig Start Date End Date Taking? Authorizing Provider  amoxicillin-clavulanate (AUGMENTIN) 875-125 MG  tablet Take 1 tablet by mouth every 12 (twelve) hours. 11/24/21  Yes Tomi Bamberger, PA-C  acetaminophen (TYLENOL) 500 MG tablet Take 500 mg by mouth every 6 (six) hours as needed for mild pain.    [provider]  atorvastatin (LIPITOR) 20 MG tablet Take 1 tablet (20 mg total) by mouth daily. 08/09/20 11/07/20  Claiborne Rigg, NP  benzonatate (TESSALON) 100 MG capsule Take 1 capsule (100 mg total) by mouth every 8 (eight) hours as needed for cough. 02/20/21   Gustavus Bryant, FNP  fluticasone (FLONASE) 50 MCG/ACT nasal spray Place 1 spray into both nostrils daily for 3 days. 02/20/21 02/23/21  Gustavus Bryant, FNP  losartan (COZAAR) 25 MG tablet Take 1 tablet (25 mg total) by mouth daily. 08/09/20 11/07/20  Claiborne Rigg, NP  omeprazole (PRILOSEC) 20 MG capsule TAKE 1 CAPSULE (20 MG TOTAL) BY MOUTH DAILY FOR 28 DAYS. 06/15/20 06/15/21  Placido Sou, PA-C    Family History Family History  Problem Relation Age of Onset   CAD Mother     Social History Social History   Tobacco Use   Smoking status: Every Day    Packs/day: 0.25    Types: Cigarettes   Smokeless tobacco: Never  Vaping Use   Vaping Use: Never used  Substance Use Topics   Alcohol use: Not Currently   Drug use: Not Currently     Allergies   Shellfish  allergy, Contrast media [iodinated contrast media], and Lactose intolerance (gi)   Review of Systems Review of Systems  Constitutional:  Negative for chills and fever.  HENT:  Positive for congestion and ear pain. Negative for sore throat.   Eyes:  Negative for discharge and redness.  Respiratory:  Positive for cough. Negative for shortness of breath and wheezing.   Gastrointestinal:  Negative for abdominal pain, diarrhea, nausea and vomiting.  Neurological:  Positive for headaches.     Physical Exam Triage Vital Signs ED Triage Vitals  Enc Vitals Group     BP 11/24/21 1100 123/79     Pulse Rate 11/24/21 1059 82     Resp 11/24/21 1059 19     Temp  11/24/21 1059 99 F (37.2 C)     Temp src --      SpO2 11/24/21 1059 98 %     Weight --      Height --      Head Circumference --      Peak Flow --      Pain Score 11/24/21 1058 6     Pain Loc --      Pain Edu? --      Excl. in GC? --    No data found.  Updated Vital Signs BP 123/79   Pulse 82   Temp 99 F (37.2 C)   Resp 19   SpO2 98%   Visual Acuity Right Eye Distance:   Left Eye Distance:   Bilateral Distance:    Right Eye Near:   Left Eye Near:    Bilateral Near:     Physical Exam Vitals and nursing note reviewed.  Constitutional:      General: She is not in acute distress.    Appearance: Normal appearance. She is not ill-appearing.  HENT:     Head: Normocephalic and atraumatic.     Ears:     Comments: Bilateral TMs erythematous and bulging, left worse than right    Nose: Congestion present.     Mouth/Throat:     Mouth: Mucous membranes are moist.     Pharynx: No oropharyngeal exudate or posterior oropharyngeal erythema.  Eyes:     Conjunctiva/sclera: Conjunctivae normal.  Cardiovascular:     Rate and Rhythm: Normal rate and regular rhythm.     Heart sounds: Normal heart sounds. No murmur heard. Pulmonary:     Effort: Pulmonary effort is normal. No respiratory distress.     Breath sounds: Normal breath sounds. No wheezing, rhonchi or rales.  Skin:    General: Skin is warm and dry.  Neurological:     Mental Status: She is alert.  Psychiatric:        Mood and Affect: Mood normal.        Thought Content: Thought content normal.      UC Treatments / Results  Labs (all labs ordered are listed, but only abnormal results are displayed) Labs Reviewed  RESP PANEL BY RT-PCR (FLU A&B, COVID) [id number]    EKG   Radiology No results found.  Procedures Procedures (including critical care time)  Medications Ordered in UC Medications - No data to display  Initial Impression / Assessment and Plan / UC Course  I have reviewed the triage vital signs  and the nursing notes.  Pertinent labs & imaging results that were available during my care of the patient were reviewed by me and considered in my medical decision making (see chart for details).    Will treat  to cover sinusitis and otitis media with Augmentin. Screening ordered for covid and flu. Encouraged follow up if no gradual improvement or with any further concerns.   Final Clinical Impressions(s) / UC Diagnoses   Final diagnoses:  Acute upper respiratory infection  Encounter for screening for COVID-19  Other non-recurrent acute nonsuppurative otitis media of both ears   Discharge Instructions   None    ED Prescriptions     Medication Sig Dispense Auth. Provider   amoxicillin-clavulanate (AUGMENTIN) 875-125 MG tablet Take 1 tablet by mouth every 12 (twelve) hours. 14 tablet Tomi Bamberger, PA-C      PDMP not reviewed this encounter.   Tomi Bamberger, PA-C 11/24/21 [phone]

## 2021-12-02 ENCOUNTER — Ambulatory Visit (HOSPITAL_COMMUNITY)
Admission: EM | Admit: 2021-12-02 | Discharge: 2021-12-02 | Disposition: A | Discharge status: Home / Self Care | Payer: Commercial Managed Care - HMO | Attending: Family Medicine | Admitting: Family Medicine

## 2021-12-02 ENCOUNTER — Encounter (HOSPITAL_COMMUNITY): Payer: Self-pay

## 2021-12-02 DIAGNOSIS — H6993 Unspecified Eustachian tube disorder, bilateral: Secondary | ICD-10-CM | POA: Diagnosis not present

## 2021-12-02 DIAGNOSIS — H6503 Acute serous otitis media, bilateral: Secondary | ICD-10-CM | POA: Diagnosis not present

## 2021-12-02 MED ORDER — PREDNISONE 20 MG PO TABS: 40.0000 mg | ORAL_TABLET | Freq: Every day | ORAL | 0 refills | Status: DC

## 2021-12-02 MED ORDER — CEFDINIR 300 MG PO CAPS: 300.0000 mg | ORAL_CAPSULE | Freq: Two times a day (BID) | ORAL | 0 refills | Status: DC

## 2021-12-02 NOTE — ED Triage Notes (Signed)
Pt reports bilateral hearing loss since last visit on 11/24/2021. She was prescribed Amoxicillin.

## 2021-12-06 NOTE — ED Provider Notes (Signed)
  [facility]   [id number] 12/02/21 Arrival Time: 1652  ASSESSMENT & PLAN:  1. Eustachian tube dysfunction, bilateral   2. Non-recurrent acute serous otitis media of both ears    Broaden antibiotic coverage. Trial of prednisone. OTC symptom care as needed.  Discharge Medication List as of 12/02/2021  6:15 PM     START taking these medications   Details  cefdinir (OMNICEF) 300 MG capsule Take 1 capsule (300 mg total) by mouth 2 (two) times daily., Starting Sat 12/02/2021, Normal    predniSONE (DELTASONE) 20 MG tablet Take 2 tablets (40 mg total) by mouth daily., Starting Sat 12/02/2021, Normal         Follow-up Information     Schedule an appointment as soon as possible for a visit  with [facility].   Contact information: [address] [place] [address] [phone]                 Reviewed expectations re: course of current medical issues. Questions answered. Outlined signs and symptoms indicating need for more acute intervention. Understanding verbalized. After Visit Summary given.   SUBJECTIVE: History from: Patient. Joyce Peters is a 51 y.o. female. Reports: Pt reports bilateral hearing loss since last visit on 11/24/2021. She was prescribed Amoxicillin.  Still the same. Afebrile. Normal PO intake without n/v/d.  OBJECTIVE:  General appearance: alert; no distress Eyes: PERRLA; EOMI; conjunctiva normal HENT: [place]; AT; without nasal congestion; bilateral significant serous otitis Neck: supple  Lungs: speaks full sentences without difficulty; unlabored Extremities: no edema Skin: warm and dry Neurologic: normal gait Psychological: alert and cooperative; normal mood and affect  Labs:  Labs Reviewed - No data to display  Imaging: No results found.  Allergies  Allergen Reactions   Shellfish Allergy Anaphylaxis   Contrast Media [Iodinated Contrast Media]    Lactose Intolerance (Gi)      Past Medical History:  Diagnosis Date   Anxiety    GERD (gastroesophageal reflux disease)    Hypertension    SVT (supraventricular tachycardia) 10/20/2019   Social History   Socioeconomic History   Marital status: Single    Spouse name: Not on file   Number of children: Not on file   Years of education: Not on file   Highest education level: Not on file  Occupational History   Not on file  Tobacco Use   Smoking status: Every Day    Packs/day: 0.25    Types: Cigarettes   Smokeless tobacco: Never  Vaping Use   Vaping Use: Never used  Substance and Sexual Activity   Alcohol use: Not Currently   Drug use: Not Currently   Sexual activity: Not on file  Other Topics Concern   Not on file  Social History Narrative   Not on file   Social Determinants of Health   Financial Resource Strain: Not on file  Food Insecurity: Not on file  Transportation Needs: Not on file  Physical Activity: Not on file  Stress: Not on file  Social Connections: Not on file  Intimate Partner Violence: Not on file   Family History  Problem Relation Age of Onset   CAD Mother    Past Surgical History:  Procedure Laterality Date   CESAREAN SECTION     CESAREAN SECTION       Mardella Layman, MD 12/06/21 [phone]

## 2023-03-02 ENCOUNTER — Emergency Department (HOSPITAL_COMMUNITY)
Admission: EM | Admit: 2023-03-02 | Discharge: 2023-03-02 | Disposition: A | Discharge status: Home / Self Care | Payer: Medicaid Other | Attending: Emergency Medicine | Admitting: Emergency Medicine

## 2023-03-02 ENCOUNTER — Encounter (HOSPITAL_COMMUNITY): Payer: Self-pay

## 2023-03-02 ENCOUNTER — Other Ambulatory Visit: Payer: Self-pay

## 2023-03-02 DIAGNOSIS — I471 Supraventricular tachycardia, unspecified: Secondary | ICD-10-CM | POA: Diagnosis not present

## 2023-03-02 DIAGNOSIS — R002 Palpitations: Secondary | ICD-10-CM | POA: Diagnosis present

## 2023-03-02 DIAGNOSIS — R079 Chest pain, unspecified: Secondary | ICD-10-CM | POA: Diagnosis not present

## 2023-03-02 DIAGNOSIS — Z79899 Other long term (current) drug therapy: Secondary | ICD-10-CM | POA: Diagnosis not present

## 2023-03-02 DIAGNOSIS — I10 Essential (primary) hypertension: Secondary | ICD-10-CM | POA: Diagnosis not present

## 2023-03-02 DIAGNOSIS — I213 ST elevation (STEMI) myocardial infarction of unspecified site: Secondary | ICD-10-CM | POA: Diagnosis not present

## 2023-03-02 DIAGNOSIS — R000 Tachycardia, unspecified: Secondary | ICD-10-CM | POA: Diagnosis not present

## 2023-03-02 LAB — CBC WITH DIFFERENTIAL/PLATELET
Abs Immature Granulocytes: 0.02 10*3/uL (ref 0.00–0.07)
Basophils Absolute: 0 10*3/uL (ref 0.0–0.1)
Basophils Relative: 1 %
Eosinophils Absolute: 0.2 10*3/uL (ref 0.0–0.5)
Eosinophils Relative: 3 %
HCT: 37.7 % (ref 36.0–46.0)
Hemoglobin: 12 g/dL (ref 12.0–15.0)
Immature Granulocytes: 0 %
Lymphocytes Relative: 43 %
Lymphs Abs: 2.9 10*3/uL (ref 0.7–4.0)
MCH: 27.7 pg (ref 26.0–34.0)
MCHC: 31.8 g/dL (ref 30.0–36.0)
MCV: 87.1 fL (ref 80.0–100.0)
Monocytes Absolute: 0.5 10*3/uL (ref 0.1–1.0)
Monocytes Relative: 8 %
Neutro Abs: 2.9 10*3/uL (ref 1.7–7.7)
Neutrophils Relative %: 45 %
Platelets: 275 10*3/uL (ref 150–400)
RBC: 4.33 MIL/uL (ref 3.87–5.11)
RDW: 13.8 % (ref 11.5–15.5)
WBC: 6.6 10*3/uL (ref 4.0–10.5)
nRBC: 0 % (ref 0.0–0.2)

## 2023-03-02 LAB — BASIC METABOLIC PANEL
Anion gap: 9 (ref 5–15)
BUN: 13 mg/dL (ref 6–20)
CO2: 26 mmol/L (ref 22–32)
Calcium: 9 mg/dL (ref 8.9–10.3)
Chloride: 108 mmol/L (ref 98–111)
Creatinine, Ser: 0.99 mg/dL (ref 0.44–1.00)
GFR, Estimated: >60 mL/min (ref >60)
Glucose, Bld: 132 mg/dL — ABNORMAL HIGH (ref 70–99)
Potassium: 3.6 mmol/L (ref 3.5–5.1)
Sodium: 143 mmol/L (ref 135–145)

## 2023-03-02 LAB — MAGNESIUM: Magnesium: 1.8 mg/dL (ref 1.7–2.4)

## 2023-03-02 MED ORDER — SODIUM CHLORIDE 0.9 % IV BOLUS
500.0000 mL | Freq: Once | INTRAVENOUS | Status: AC
  Administered 2023-03-02: 500 mL via INTRAVENOUS

## 2023-03-02 NOTE — ED Triage Notes (Signed)
 Pt BIB [facility] from home. Pt called EMS for a feeling of chest pressure and "heart racing". . Similar feeling today but resolved. Upon EMS arrival pt was in SVT at 180P. EMS gave 6 mg of Adenosine. Pt converted into NSR and chest pressure has resolved since pulse has come down. Hx of SVT   EMS 90P post Adenosine 140/92 98% RA 18 LAC

## 2023-03-02 NOTE — ED Provider Notes (Signed)
 [place] EMERGENCY DEPARTMENT AT [facility] Provider Note  CSN: [id number] Arrival date & time: 03/02/23 0335  Chief Complaint(s) Tachycardia  HPI Joyce Peters is a 53 y.o. female    The history is provided by the patient.  Palpitations Palpitations quality:  Fast Onset quality:  Sudden Duration:  30 minutes Timing:  Constant Progression:  Unchanged Chronicity:  Recurrent Relieved by: 6mg  adenosine by EMS. Worsened by:  Nothing Associated symptoms: no chest pain, no cough, no dizziness, no leg pain, no nausea, no numbness, no shortness of breath and no vomiting    H/o SVT   Past Medical History Past Medical History:  Diagnosis Date   Anxiety    GERD (gastroesophageal reflux disease)    Hypertension    SVT (supraventricular tachycardia) (HCC) 10/20/2019   Patient Active Problem List   Diagnosis Date Noted   SVT (supraventricular tachycardia) (HCC) 10/20/2019   Elevated troponin 10/20/2019   Chest pain 10/20/2019   Home Medication(s) Prior to Admission medications   Medication Sig Start Date End Date Taking? Authorizing Provider  acetaminophen (TYLENOL) 500 MG tablet Take 500 mg by mouth every 6 (six) hours as needed for mild pain.    [provider]  atorvastatin (LIPITOR) 20 MG tablet Take 1 tablet (20 mg total) by mouth daily. 08/09/20 11/07/20  Claiborne Rigg, NP  benzonatate (TESSALON) 100 MG capsule Take 1 capsule (100 mg total) by mouth every 8 (eight) hours as needed for cough. 02/20/21   Gustavus Bryant, FNP  cefdinir (OMNICEF) 300 MG capsule Take 1 capsule (300 mg total) by mouth 2 (two) times daily. 12/02/21   Mardella Layman, MD  fluticasone (FLONASE) 50 MCG/ACT nasal spray Place 1 spray into both nostrils daily for 3 days. 02/20/21 02/23/21  Gustavus Bryant, FNP  losartan (COZAAR) 25 MG tablet Take 1 tablet (25 mg total) by mouth daily. 08/09/20 11/07/20  Claiborne Rigg, NP  omeprazole (PRILOSEC) 20 MG capsule TAKE 1 CAPSULE (20 MG TOTAL)  BY MOUTH DAILY FOR 28 DAYS. 06/15/20 06/15/21  Placido Sou, PA-C  predniSONE (DELTASONE) 20 MG tablet Take 2 tablets (40 mg total) by mouth daily. 12/02/21   Mardella Layman, MD                                                                                                                                    Allergies Shellfish allergy, Contrast media [iodinated contrast media], and Lactose intolerance (gi)  Review of Systems Review of Systems  Respiratory:  Negative for cough and shortness of breath.   Cardiovascular:  Positive for palpitations. Negative for chest pain.  Gastrointestinal:  Negative for nausea and vomiting.  Neurological:  Negative for dizziness and numbness.   As noted in HPI  Physical Exam Vital Signs  I have reviewed the triage vital signs BP (!) 151/89   Pulse 85   Temp 98.2 F (36.8 C) (Oral)   Resp Marland Kitchen)  21   Ht 5\' 7"  (1.702 m)   Wt 99.8 kg   SpO2 100%   BMI 34.46 kg/m   Physical Exam Vitals reviewed.  Constitutional:      General: She is not in acute distress.    Appearance: She is well-developed. She is not diaphoretic.  HENT:     Head: Normocephalic and atraumatic.     Nose: Nose normal.  Eyes:     General: No scleral icterus.       Right eye: No discharge.        Left eye: No discharge.     Conjunctiva/sclera: Conjunctivae normal.     Pupils: Pupils are equal, round, and reactive to light.  Cardiovascular:     Rate and Rhythm: Normal rate and regular rhythm.     Heart sounds: No murmur heard.    No friction rub. No gallop.  Pulmonary:     Effort: Pulmonary effort is normal. No respiratory distress.     Breath sounds: Normal breath sounds. No stridor. No rales.  Abdominal:     General: There is no distension.     Palpations: Abdomen is soft.     Tenderness: There is no abdominal tenderness.  Musculoskeletal:        General: No tenderness.     Cervical back: Normal range of motion and neck supple.  Skin:    General: Skin is warm and dry.      Findings: No erythema or rash.  Neurological:     Mental Status: She is alert and oriented to person, place, and time.     ED Results and Treatments Labs (all labs ordered are listed, but only abnormal results are displayed) Labs Reviewed  BASIC METABOLIC PANEL - Abnormal; Notable for the following components:      Result Value   Glucose, Bld 132 (*)    All other components within normal limits  CBC WITH DIFFERENTIAL/PLATELET  MAGNESIUM                                                                                                                         EKG  EKG Interpretation Date/Time:  Saturday March 02 2023 03:42:09 EST Ventricular Rate:  90 PR Interval:  171 QRS Duration:  86 QT Interval:  381 QTC Calculation: 467 R Axis:   71  Text Interpretation: Sinus rhythm Consider left atrial enlargement Probable anteroseptal infarct, old Borderline repolarization abnormality No significant change was found Confirmed by Drema Pry [id number]) on 03/02/2023 4:10:38 AM       Radiology No results found.  Medications Ordered in ED Medications  sodium chloride 0.9 % bolus 500 mL (500 mLs Intravenous New Bag/Given 03/02/23 0454)   Procedures Procedures  (including critical care time) Medical Decision Making / ED Course   Medical Decision Making Amount and/or Complexity of Data Reviewed Labs: ordered. Decision-making details documented in ED Course.     Clinical Course as of 03/02/23 3086  Sat Mar 02, 2023  1610 Patient presented for SVT that converted with 6 mg of adenosine and route.  Currently asymptomatic.  EKG with normal sinus rhythm.  No acute ischemic changes.  Labs are reassuring without evidence of severe anemia.  No significant electrolyte derangements or renal sufficiency.  Patient provided with IV fluids.  Plan to monitor. If stable, anticipate discharge. [PC]  [phone] Patient has remained stable without recurrence of SVT. [PC]    Clinical Course User  Index [PC] Dashawna Delbridge, Amadeo Garnet, MD    Final Clinical Impression(s) / ED Diagnoses Final diagnoses:  SVT (supraventricular tachycardia) (HCC)   The patient appears reasonably screened and/or stabilized for discharge and I doubt any other medical condition or other [facility] requiring further screening, evaluation, or treatment in the ED at this time. I have discussed the findings, Dx and Tx plan with the patient/family who expressed understanding and agree(s) with the plan. Discharge instructions discussed at length. The patient/family was given strict return precautions who verbalized understanding of the instructions. No further questions at time of discharge.  Disposition: Discharge  Condition: Good  ED Discharge Orders          Ordered    Ambulatory referral to Cardiology       Comments: If you have not heard from the Cardiology office within the next 72 hours please call [phone].   03/02/23 7829             Follow Up: Primary care provider  Call  to schedule an appointment for close follow up    This chart was dictated using voice recognition software.  Despite best efforts to proofread,  errors can occur which can change the documentation meaning.    Nira Conn, MD 03/02/23 [phone]

## 2023-03-12 ENCOUNTER — Emergency Department (HOSPITAL_BASED_OUTPATIENT_CLINIC_OR_DEPARTMENT_OTHER): Payer: Medicaid Other

## 2023-03-12 ENCOUNTER — Emergency Department (HOSPITAL_COMMUNITY): Payer: Medicaid Other

## 2023-03-12 ENCOUNTER — Observation Stay (HOSPITAL_COMMUNITY)
Admission: EM | Admit: 2023-03-12 | Discharge: 2023-03-13 | Disposition: A | Discharge status: Home / Self Care | Payer: Medicaid Other | Attending: Emergency Medicine | Admitting: Emergency Medicine

## 2023-03-12 ENCOUNTER — Encounter (HOSPITAL_COMMUNITY): Payer: Self-pay | Admitting: Infectious Diseases

## 2023-03-12 ENCOUNTER — Other Ambulatory Visit: Payer: Self-pay

## 2023-03-12 DIAGNOSIS — R7989 Other specified abnormal findings of blood chemistry: Secondary | ICD-10-CM | POA: Diagnosis not present

## 2023-03-12 DIAGNOSIS — R002 Palpitations: Secondary | ICD-10-CM | POA: Diagnosis present

## 2023-03-12 DIAGNOSIS — R0602 Shortness of breath: Secondary | ICD-10-CM | POA: Diagnosis not present

## 2023-03-12 DIAGNOSIS — I471 Supraventricular tachycardia, unspecified: Secondary | ICD-10-CM | POA: Diagnosis not present

## 2023-03-12 DIAGNOSIS — R778 Other specified abnormalities of plasma proteins: Secondary | ICD-10-CM | POA: Diagnosis not present

## 2023-03-12 DIAGNOSIS — Z720 Tobacco use: Secondary | ICD-10-CM | POA: Diagnosis not present

## 2023-03-12 DIAGNOSIS — E785 Hyperlipidemia, unspecified: Secondary | ICD-10-CM | POA: Diagnosis not present

## 2023-03-12 DIAGNOSIS — I2489 Other forms of acute ischemic heart disease: Secondary | ICD-10-CM

## 2023-03-12 DIAGNOSIS — R0689 Other abnormalities of breathing: Secondary | ICD-10-CM | POA: Diagnosis not present

## 2023-03-12 DIAGNOSIS — I10 Essential (primary) hypertension: Secondary | ICD-10-CM | POA: Diagnosis not present

## 2023-03-12 DIAGNOSIS — Z79899 Other long term (current) drug therapy: Secondary | ICD-10-CM | POA: Diagnosis not present

## 2023-03-12 DIAGNOSIS — I214 Non-ST elevation (NSTEMI) myocardial infarction: Secondary | ICD-10-CM | POA: Insufficient documentation

## 2023-03-12 DIAGNOSIS — R000 Tachycardia, unspecified: Secondary | ICD-10-CM | POA: Diagnosis not present

## 2023-03-12 DIAGNOSIS — R457 State of emotional shock and stress, unspecified: Secondary | ICD-10-CM | POA: Diagnosis not present

## 2023-03-12 LAB — ECHOCARDIOGRAM COMPLETE
AR max vel: 2.41 cm2
AV Area VTI: 2.45 cm2
AV Area mean vel: 2.37 cm2
AV Mean grad: 3 mmHg
AV Peak grad: 5.8 mmHg
Ao pk vel: 1.2 m/s
Area-P 1/2: 4.19 cm2
S' Lateral: 2.4 cm

## 2023-03-12 LAB — CBC WITH DIFFERENTIAL/PLATELET
Abs Immature Granulocytes: 0.02 10*3/uL (ref 0.00–0.07)
Basophils Absolute: 0.1 10*3/uL (ref 0.0–0.1)
Basophils Relative: 1 %
Eosinophils Absolute: 0.1 10*3/uL (ref 0.0–0.5)
Eosinophils Relative: 1 %
HCT: 38.8 % (ref 36.0–46.0)
Hemoglobin: 12.6 g/dL (ref 12.0–15.0)
Immature Granulocytes: 0 %
Lymphocytes Relative: 38 %
Lymphs Abs: 2.3 10*3/uL (ref 0.7–4.0)
MCH: 28.1 pg (ref 26.0–34.0)
MCHC: 32.5 g/dL (ref 30.0–36.0)
MCV: 86.4 fL (ref 80.0–100.0)
Monocytes Absolute: 0.6 10*3/uL (ref 0.1–1.0)
Monocytes Relative: 10 %
Neutro Abs: 3 10*3/uL (ref 1.7–7.7)
Neutrophils Relative %: 50 %
Platelets: 302 10*3/uL (ref 150–400)
RBC: 4.49 MIL/uL (ref 3.87–5.11)
RDW: 13.7 % (ref 11.5–15.5)
WBC: 6 10*3/uL (ref 4.0–10.5)
nRBC: 0 % (ref 0.0–0.2)

## 2023-03-12 LAB — COMPREHENSIVE METABOLIC PANEL
ALT: 21 U/L (ref 0–44)
AST: 25 U/L (ref 15–41)
Albumin: 3.5 g/dL (ref 3.5–5.0)
Alkaline Phosphatase: 56 U/L (ref 38–126)
Anion gap: 11 (ref 5–15)
BUN: 12 mg/dL (ref 6–20)
CO2: 28 mmol/L (ref 22–32)
Calcium: 9.2 mg/dL (ref 8.9–10.3)
Chloride: 104 mmol/L (ref 98–111)
Creatinine, Ser: 0.89 mg/dL (ref 0.44–1.00)
GFR, Estimated: >60 mL/min (ref >60)
Glucose, Bld: 114 mg/dL — ABNORMAL HIGH (ref 70–99)
Potassium: 3.5 mmol/L (ref 3.5–5.1)
Sodium: 143 mmol/L (ref 135–145)
Total Bilirubin: 0.5 mg/dL (ref 0.0–1.2)
Total Protein: 6.8 g/dL (ref 6.5–8.1)

## 2023-03-12 LAB — TROPONIN I (HIGH SENSITIVITY)
Troponin I (High Sensitivity): 482 ng/L (ref <18)
Troponin I (High Sensitivity): 1237 ng/L (ref <18)
Troponin I (High Sensitivity): 681 ng/L (ref <18)

## 2023-03-12 LAB — LIPID PANEL
Cholesterol: 224 mg/dL — ABNORMAL HIGH (ref 0–200)
HDL: 47 mg/dL (ref >40)
LDL Cholesterol: 160 mg/dL — ABNORMAL HIGH (ref 0–99)
Total CHOL/HDL Ratio: 4.8 ratio
Triglycerides: 84 mg/dL (ref <150)
VLDL: 17 mg/dL (ref 0–40)

## 2023-03-12 LAB — MAGNESIUM: Magnesium: 1.9 mg/dL (ref 1.7–2.4)

## 2023-03-12 LAB — HEMOGLOBIN A1C
Hgb A1c MFr Bld: 6 % — ABNORMAL HIGH (ref 4.8–5.6)
Mean Plasma Glucose: 125.5 mg/dL

## 2023-03-12 LAB — TSH: TSH: 1.284 u[IU]/mL (ref 0.350–4.500)

## 2023-03-12 MED ORDER — PREDNISONE 20 MG PO TABS
50.0000 mg | ORAL_TABLET | Freq: Four times a day (QID) | ORAL | Status: AC
  Administered 2023-03-13 (×3): 50 mg via ORAL
  Filled 2023-03-12 (×3): qty 1

## 2023-03-12 MED ORDER — METOPROLOL TARTRATE 25 MG PO TABS: 12.5000 mg | ORAL_TABLET | Freq: Two times a day (BID) | ORAL | Status: DC

## 2023-03-12 MED ORDER — ENOXAPARIN SODIUM 40 MG/0.4ML IJ SOSY
40.0000 mg | PREFILLED_SYRINGE | INTRAMUSCULAR | Status: DC
  Administered 2023-03-12: 40 mg via SUBCUTANEOUS
  Filled 2023-03-12: qty 0.4

## 2023-03-12 MED ORDER — ASPIRIN 81 MG PO CHEW
324.0000 mg | CHEWABLE_TABLET | Freq: Once | ORAL | Status: AC
  Administered 2023-03-12: 324 mg via ORAL
  Filled 2023-03-12: qty 4

## 2023-03-12 MED ORDER — DIPHENHYDRAMINE HCL 25 MG PO CAPS
50.0000 mg | ORAL_CAPSULE | Freq: Once | ORAL | Status: AC
  Administered 2023-03-13: 50 mg via ORAL
  Filled 2023-03-12: qty 2

## 2023-03-12 MED ORDER — CARVEDILOL 3.125 MG PO TABS
3.1250 mg | ORAL_TABLET | Freq: Two times a day (BID) | ORAL | Status: DC
  Administered 2023-03-12 – 2023-03-13 (×3): 3.125 mg via ORAL
  Filled 2023-03-12 (×3): qty 1

## 2023-03-12 MED ORDER — DIAZEPAM 5 MG PO TABS
5.0000 mg | ORAL_TABLET | Freq: Once | ORAL | Status: AC
  Administered 2023-03-12: 5 mg via ORAL
  Filled 2023-03-12: qty 1

## 2023-03-12 MED ORDER — DIAZEPAM 2 MG PO TABS: 2.0000 mg | ORAL_TABLET | Freq: Once | ORAL | Status: DC

## 2023-03-12 MED ORDER — METOPROLOL TARTRATE 100 MG PO TABS: 100.0000 mg | ORAL_TABLET | Freq: Once | ORAL | Status: DC

## 2023-03-12 MED ORDER — ADENOSINE 6 MG/2ML IV SOLN
6.0000 mg | Freq: Once | INTRAVENOUS | Status: AC
  Administered 2023-03-12: 6 mg via INTRAVENOUS

## 2023-03-12 NOTE — ED Provider Notes (Signed)
 Received patient in turnover from Dr. Madilyn Hook.  Please see their note for further details of Hx, PE.  Briefly patient is a 53 y.o. female with a Tachycardia and Palpitations .  Patient with SVT.  Converted with IV attempt.  EKG post concerning with diffuse ST depression.  Plan for trop, discussion with cards.  Troponin was elevated at 480.  I discussed this with the cardiologist on-call, Dr. Mayford Knife.  Recommended stat TTE.  This was performed without obvious wall motion abnormality.  Normal EF.  Second troponin was significantly higher than the first at 1200.  I rediscussed the case with Dr. Mayford Knife, cardiology to perform formal consult at bedside.  CRITICAL CARE Performed by: Rae Roam   Total critical care time: 35 minutes  Critical care time was exclusive of separately billable procedures and treating other patients.  Critical care was necessary to treat or prevent imminent or life-threatening deterioration.  Critical care was time spent personally by me on the following activities: development of treatment plan with patient and/or surrogate as well as nursing, discussions with consultants, evaluation of patient's response to treatment, examination of patient, obtaining history from patient or surrogate, ordering and performing treatments and interventions, ordering and review of laboratory studies, ordering and review of radiographic studies, pulse oximetry and re-evaluation of patient's condition.    Melene Plan, DO 03/12/23 [phone]

## 2023-03-12 NOTE — Consult Note (Addendum)
 Cardiology Consultation   Patient ID: Jera Headings MRN: [id number]; DOB: 04/13/1970  Admit date: 03/12/2023 Date of Consult: 03/12/2023  PCP:  Aviva Kluver   [facility] Providers Cardiologist:  Bryan Lemma, MD   {   Patient Profile:   Joyce Peters is a 53 y.o. female with a hx of SVT, anxiety, obesity, marijuana use,  tobacco use, who is being seen 03/12/2023 for the evaluation of SVT at the request of Dr Madilyn Hook.  History of Present Illness:   Ms. Chenard with above PMH who presented to today for heart palpitation and chest pressure. Symptoms started around 430 am. She is going through a lot stress lately due to family and grandchildren. She had tried vagal maneuvers which did not help. She states she came to ER last week for this as well, HR improved after receiving IV medication. She felt she had multiple SVT episodes over the past 3 weeks, each lasting 5 minutes and resolves spontaneously. This time her heart rate is persistently elevated and her symptoms are much more severe. She endorse some epigastric pressure and bloating sensation, denied any chest pain. She felt SOB at rest today. She denied any dizziness, syncope, leg edema, paresthesia, nausea. She is trying to cut down smoking, currently still smokes 6 cigarettes daily. She denied ETOH use. She uses marijuana. She states her mother had required cardiac stents in the past.   She was seen by cardiology 10/2019 when she was hospitalized for SVT. She had EKG concern for ischemia and elevated trop 300s. Echo showed LVEF 60-65%, no RWMA, normal diastolic parameters, normal RV and PASP, mild LAE, trivial MR. CT coronary study 10/21/19 showed Coronary calcium score of 0. No evidence of CAD. Her baselien HR was 40-50s, therefore AVN blocker were noted used. She never followed up since.   Admission labs today showed unremarkable CMP and CBC. Hs trop 482 >1237. TSH WNL. CXR showed no acute findings. Echo from today showed LVEF  60-65%, no RWMA, mild LVH, normal RV. Cardiology is consulted for further evaluation today.   On my evaluation the patient is feeling better now.  Occasionally still feeling some skipped beats but nothing prolonged.  No further chest discomfort.   Past Medical History:  Diagnosis Date   Anxiety    GERD (gastroesophageal reflux disease)    Hypertension    SVT (supraventricular tachycardia) (HCC) 10/20/2019    Past Surgical History:  Procedure Laterality Date   CESAREAN SECTION     CESAREAN SECTION       Home Medications:  Prior to Admission medications   Medication Sig Start Date End Date Taking? Authorizing Provider  acetaminophen (TYLENOL) 500 MG tablet Take 500 mg by mouth every 6 (six) hours as needed for mild pain.   Yes [provider]  ASHWAGANDHA PO Take 1 capsule by mouth daily.   Yes [provider]    Inpatient Medications: Scheduled Meds:  carvedilol  3.125 mg Oral BID WC   Continuous Infusions:  PRN Meds:   Allergies:    Allergies  Allergen Reactions   Shellfish Allergy Anaphylaxis   Contrast Media [Iodinated Contrast Media]     Social History:   Social History   Socioeconomic History   Marital status: Single    Spouse name: Not on file   Number of children: Not on file   Years of education: Not on file   Highest education level: Not on file  Occupational History   Not on file  Tobacco Use  Smoking status: Every Day    Current packs/day: 0.25    Types: Cigarettes   Smokeless tobacco: Never  Vaping Use   Vaping status: Never Used  Substance and Sexual Activity   Alcohol use: Not Currently   Drug use: Not Currently   Sexual activity: Not on file  Other Topics Concern   Not on file  Social History Narrative   Not on file   Social Drivers of Health   Financial Resource Strain: Not on file  Food Insecurity: Not on file  Transportation Needs: Not on file  Physical Activity: Not on file  Stress: Not on file  Social  Connections: Not on file  Intimate Partner Violence: Not on file    Family History:    Family History  Problem Relation Age of Onset   CAD Mother      ROS:  Constitutional: Denied fever, chills, malaise, night sweats Eyes: Denied vision change or loss Ears/Nose/Mouth/Throat: Denied ear ache, sore throat, coughing, sinus pain Cardiovascular: see HPI  Respiratory: see HPI  Gastrointestinal: Denied nausea, vomiting, abdominal pain, diarrhea Genital/Urinary: Denied dysuria, hematuria, urinary frequency/urgency Musculoskeletal: Denied muscle ache, joint pain, weakness Skin: Denied rash, wound Neuro: Denied headache, dizziness, syncope Psych: Denied history of depression/anxiety  Endocrine: Denied history of diabetes   Physical Exam/Data:   Vitals:   03/12/23 1245 03/12/23 1300 03/12/23 1315 03/12/23 1438  BP: (!) 155/81 (!) 171/65 (!) 163/70 (!) 170/87  Pulse: 70 70 70 78  Resp: 17 17 (!) 23 16  Temp:    98.4 F (36.9 C)  TempSrc:    Oral  SpO2: 99% 100% 99% 99%  Weight:      Height:       No intake or output data in the 24 hours ending 03/12/23 1533    03/12/2023    7:36 AM 03/12/2023    6:09 AM 03/02/2023    3:42 AM  Last 3 Weights  Weight (lbs) 230 lb 220 lb 7.4 oz 220 lb  Weight (kg) 104.327 kg 100 kg 99.791 kg     Body mass index is 36.02 kg/m.   Vitals:  Vitals:   03/12/23 1315 03/12/23 1438  BP: (!) 163/70 (!) 170/87  Pulse: 70 78  Resp: (!) 23 16  Temp:  98.4 F (36.9 C)  SpO2: 99% 99%   General Appearance: In no apparent distress, laying in bed, well nourished  HEENT: Normocephalic, atraumatic.  Neck: Supple, trachea midline, no JVDs Cardiovascular: Regular rate and rhythm, normal S1-S2,  no murmur  Respiratory: Resting breathing unlabored, lungs sounds clear to auscultation bilaterally, no use of accessory muscles. On room air.  No wheezes, rales or rhonchi.   Gastrointestinal: Bowel sounds positive, abdomen soft, non-tender, non-distended.   Extremities: Able to move all extremities in bed without difficulty, no edema of BLE Musculoskeletal: Normal muscle bulk and tone Skin: Intact, warm, dry.  Neurologic: Alert, oriented to person, place and time. no cognitive deficit, no gross focal neuro deficit Psychiatric: Normal affect. Mood is appropriate.    EKG:  The EKG was personally reviewed and demonstrates:    EKG from today 0608 showed SVT 174bpm, rate associated general ST depression  EKG from today 0645 showed sinus rhythm 63 bpm, old inferolateral TWI   EKG from today 0815 showed sinus rhythm 84 bpm, resolved TWI of inferolateral leads   Telemetry:  Telemetry was personally reviewed and demonstrates:    SVT up to 180s resolved, sinus rhythm 80s, occasional PVCs   I personally reviewed  the electrocardiograms and telemetry.  Relevant CV Studies:   Echo from today showed: EF 6065%.  No RWMA.  Normal valves.  Normal pressures.  (Personally reviewed)  CT coronary study 10/21/19:  IMPRESSION: 1. Coronary calcium score of 0. This was 0 percentile for age and sex matched control.   2. Normal coronary origin with right dominance.   3. No evidence of CAD.  Echo from 10/20/19: EF 60 to 65%.  Normal valves.  Mild LA dilation.  Otherwise normal.  Laboratory Data:  High Sensitivity Troponin:   Recent Labs  Lab 03/12/23 0814 03/12/23 1023  TROPONINIHS 482* 1,237*     Chemistry Recent Labs  Lab 03/12/23 0814  NA 143  K 3.5  CL 104  CO2 28  GLUCOSE 114*  BUN 12  CREATININE 0.89  CALCIUM 9.2  MG 1.9  GFRNONAA >60  ANIONGAP 11    Recent Labs  Lab 03/12/23 0814  PROT 6.8  ALBUMIN 3.5  AST 25  ALT 21  ALKPHOS 56  BILITOT 0.5   Lipids No results for input(s): "CHOL", "TRIG", "HDL", "LABVLDL", "LDLCALC", "CHOLHDL" in the last 168 hours.  Hematology Recent Labs  Lab 03/12/23 0814  WBC 6.0  RBC 4.49  HGB 12.6  HCT 38.8  MCV 86.4  MCH 28.1  MCHC 32.5  RDW 13.7  PLT 302   Thyroid  Recent  Labs  Lab 03/12/23 0814  TSH 1.284    BNPNo results for input(s): "BNP", "PROBNP" in the last 168 hours.  DDimer No results for input(s): "DDIMER" in the last 168 hours.   Radiology/Studies:   [facility] Chest Port 1 View Result Date: 03/12/2023 CLINICAL DATA:  Chest pain.  Supraventricular tachycardia. EXAM: PORTABLE CHEST 1 VIEW COMPARISON:  Chest radiograph dated 07/26/2020. FINDINGS: Support wires overlie the patient. No focal consolidation, pleural effusion, or pneumothorax. The cardiac silhouette is within normal limits. No acute osseous pathology. IMPRESSION: No active disease. Electronically Signed   By: Elgie Collard M.D.   On: 03/12/2023 10:18    Assessment and Plan:   Paroxysmal SVT  - presented with heart palpitation since 430 am today, had recurrent episode for 3 weeks  - EKG /telemetry with SVT initially, converted to sinus rhythm with 6mg  adenosine at ED - Hs trop 482 >1237 , TSH WNL  - Echo showed LVEF 60-65%, no RWMA, mild LVH, normal RV.  - suspect this is demand ischemia from SVT - will start low dose coreg 3.125 BID, monitor for bradycardia  - EP referral outpatient for EP study and possible ablation if symptoms were to persist despite beta-blocker.  Troponin Elevation-Likely Demand Ischemia - CT coronary study 10/2019 with no CAD - Hs trop 482 >1237  - she had SOB and palpitation during SVT episodes, no typical angina symptoms  - she is active smoker, with HTN and family hx of CAD  - although likely demand ischemia, given risk factor, it's reasonable to rule out CAD by repeat CT coronary study, plan for tomorrow  = CTA ordered along with prednisone for contrast hypersensitivity.  We will continue the carvedilol but also give additional metoprolol morning prior to study.  Tentatively scheduled for 2:00 tomorrow.  If this is normal, would be okay for discharge home. - no heparin gtt needed at this time, already got ASA, check lipid and A1C for risk stratification    HTN - BP elevated, not on meds - start coreg 3.125mg  BID, trend BP => titrate as tolerated.  Tobacco use - discussed smoking cessation  Risk Assessment/Risk Scores:   TIMI Risk Score for Unstable Angina or Non-ST Elevation MI:   The patient's TIMI risk score is 3, which indicates a 13% risk of all cause mortality, new or recurrent myocardial infarction or need for urgent revascularization in the next 14 days.{  For questions or updates, please contact [facility] Please consult [email or web address] for contact info under    Signed, Cyndi Bender, NP  03/12/2023 3:33 PM    ATTENDING ATTESTATION  I have seen, examined and evaluated the patient this afternoon in consultation along with Cyndi Bender, NP .  After reviewing all the available data and chart, we discussed the patients laboratory, study & physical findings as well as symptoms in detail.  I agree with her findings, examination as well as impression recommendations as per our discussion.    Attending adjustments noted in italics.   Patient with prior history of SVT but no breakthrough spells in several years now presents with several episodes over the last couple days.  Actually was treated with adenosine recently and broke not requiring hospitalization, this time they are not able to get an IV in place to break the SVT.  Did not respond to vagal maneuvers either.  Here in the hospital with IV in place they were able to convert to sinus rhythm.  Unfortunate, troponin did elevate the over thousand notes reasonable to assess for ischemia although it is most likely demand ischemia in the setting of prolonged tachycardia with diastolic function and potential microvascular ischemia.  I agree with starting beta-blocker and based on elevated blood pressure today would use carvedilol thickening titrated up as a both antianginal and rate control agent. Reassess once on beta-blocker and if still having recurrent episodes would then  consider EP referral for possible SVT ablation.  Coronary CTA ordered for tomorrow and the protocols in place for precontrast intensity treatment.  Orders in place.  Pending results of Coronary CTA, disposition she will be discharged home once done and read. She will need outpatient follow-up scheduled upon discharge and I have explained her that she needs to make it to her appointment    Marykay Lex, MD, MS Bryan Lemma, M.D., M.S. Interventional Cardiologist  [facility]  Pager # [phone] Phone # [phone] [address] [place], [place] [address]

## 2023-03-12 NOTE — H&P (Signed)
 Date: 03/12/2023               Patient Name:  Joyce Peters MRN: [id number]  DOB: [birth year] Age / Sex: 53 y.o., female   PCP: Pcp, No         Medical Service: Internal Medicine Teaching Service         Attending Physician: Dr. Ginnie Smart, MD      First Contact: Dr. Morrie Sheldon, MD Pager [phone]    Second Contact: Dr. Rudene Christians, DO Pager [phone]         After Hours (After 5p/  First Contact Pager: [phone]  weekends / holidays): Second Contact Pager: [phone]   SUBJECTIVE   Chief Complaint: palpitations  History of Present Illness:  Joyce Peters is a 53 year old with past medical history of SVT, anxiety and tobacco use disorder who presents with palpitations. She woke up with palpations around 4 this morning. She tried to use vasovagal maneuvers, cold packs at home but heart rates did not improve. She called EMS. In round she started having some chest pressure but has not had any since getting to ED.   Recently, she endorses high level of stress with her grandchildren who live with her. She denies recent cold symptoms and has been eating and drinking like normal.   ED Course: Troponin 482-> 1237 SVT but converted to sinus rhythm prior to giving adenosine.  Echo with EF 60-65%  Cardiology consulted and requested overnight admission. IMTS consulted.  Past Medical History SVT  Past Surgical History:  Procedure Laterality Date   CESAREAN SECTION     CESAREAN SECTION      Meds:  Current Meds  Medication Sig   acetaminophen (TYLENOL) 500 MG tablet Take 500 mg by mouth every 6 (six) hours as needed for mild pain.   ASHWAGANDHA PO Take 1 capsule by mouth daily.    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   OBJECTIVE:   Physical Exam: Blood pressure (!) 170/87, pulse 78, temperature 98.4 F (36.9 C), temperature source Oral, resp. rate 16, height 5\' 7"  (1.702 m), weight 104.3 kg, SpO2 99%.  Constitutional: well-appearing, in no acute distress Cardiovascular: regular rate and rhythm, no m/r/g Pulmonary/Chest: normal work of breathing on room air, lungs clear to auscultation bilaterally Abdominal: soft, non-tender, non-distended MSK: normal bulk and tone Neurological: alert & oriented x 3 Skin: warm and dry  Labs: CBC    Component Value Date/Time   WBC 6.0 03/12/2023 0814   RBC 4.49 03/12/2023 0814   HGB 12.6 03/12/2023 0814   HCT 38.8 03/12/2023 0814   PLT 302 03/12/2023 0814   MCV 86.4 03/12/2023 0814   MCH 28.1 03/12/2023 0814   MCHC 32.5 03/12/2023 0814   RDW 13.7 03/12/2023 0814   LYMPHSABS 2.3 03/12/2023 0814   MONOABS 0.6 03/12/2023 0814   EOSABS 0.1 03/12/2023 0814   BASOSABS 0.1 03/12/2023 0814     CMP     Component Value Date/Time   NA 143  03/12/2023 0814   K 3.5 03/12/2023 0814   CL 104 03/12/2023 0814   CO2 28 03/12/2023 0814   GLUCOSE 114 (H) 03/12/2023 0814   BUN 12 03/12/2023 0814   CREATININE 0.89 03/12/2023 0814   CALCIUM 9.2 03/12/2023 0814   PROT 6.8 03/12/2023 0814   ALBUMIN 3.5 03/12/2023 0814   AST 25 03/12/2023 0814   ALT 21 03/12/2023 0814   ALKPHOS 56 03/12/2023 0814   BILITOT 0.5 03/12/2023 0814   GFRNONAA >60 03/12/2023 0814   GFRAA >60 10/20/2019 0512    Imaging: CXR- no active disease  EKG: SVT  ASSESSMENT & PLAN:   Assessment & Plan by Problem: Principal Problem:   SVT (supraventricular tachycardia) (HCC)   Joyce Peters is a 53 y.o. person  living with a history of SVT, tobacco abuse, who presented with palpitations and admitted for SVT on hospital day 0  Paroxysmal SVT She has had 2 episodes of this in last few weeks. Initial EKG with SVT. Echo showed normal EF and wall motion. She was admitted in 2021 with similar picture. Cardiology planning for EP follow-up as oupt with possible ablation. Has history of contrast allergy. Steroids and benadryl ordered  - cardiology following, needs outpatient EP referral - Coreg 3.125 mg BID - trend BMP, CBC - UDS - TSH  NSTEMI Troponin elevated to 482-> 1237. No chest pain or shortness of breath. EKG without ischemic changes. Cardiology consulted and thinking type 2 NSTEMI with elevated heart rates. With family history of CAD, cardiology does want to repeat CT coronary study.   - Coronary CT scheduled at 1430 2/26 - trend troponin til peak - lipid panel and A1c  Tobacco use disorder Ask about cessation tomorrow. Could offer chantix or NRT.   Diet: Normal VTE: Enoxaparin IVF: None,None Code: Full  Prior to Admission Living Arrangement: Home, living daughter Anticipated Discharge Location: Home Barriers to Discharge: discharge 2/26 following CT coronary  Dispo: Admit patient to Observation with expected length of stay less than 2 midnights.  Signed: Rudene Christians, DO Internal Medicine Resident PGY-3  03/12/2023, 5:21 PM

## 2023-03-12 NOTE — Plan of Care (Signed)

## 2023-03-12 NOTE — ED Provider Notes (Signed)
 [place] EMERGENCY DEPARTMENT AT [facility] Provider Note   CSN: [id number] Arrival date & time: 03/12/23  0601     History  Chief Complaint  Patient presents with   Tachycardia   Palpitations    Joyce Peters is a 53 y.o. female.  The history is provided by the patient and the EMS personnel.  Palpitations [id number] Joyce Peters is a 53 y.o. female who presents to the Emergency Department complaining of palpitations.  She presents to the emergency department by EMS for evaluation of palpitations that started around 5 AM.  She has a history of SVT in the past.  At times she has been able to convert with vagal maneuvers but these did not work.  EMS was unable to establish IV access and route to the hospital.  She has been under increased stress lately.  Has chest pressure with her palpitations.  No additional symptoms.     Home Medications Prior to Admission medications   Medication Sig Start Date End Date Taking? Authorizing Provider  acetaminophen (TYLENOL) 500 MG tablet Take 500 mg by mouth every 6 (six) hours as needed for mild pain.    [provider]  atorvastatin (LIPITOR) 20 MG tablet Take 1 tablet (20 mg total) by mouth daily. 08/09/20 11/07/20  Claiborne Rigg, NP  benzonatate (TESSALON) 100 MG capsule Take 1 capsule (100 mg total) by mouth every 8 (eight) hours as needed for cough. 02/20/21   Gustavus Bryant, FNP  cefdinir (OMNICEF) 300 MG capsule Take 1 capsule (300 mg total) by mouth 2 (two) times daily. 12/02/21   Mardella Layman, MD  fluticasone (FLONASE) 50 MCG/ACT nasal spray Place 1 spray into both nostrils daily for 3 days. 02/20/21 02/23/21  Gustavus Bryant, FNP  losartan (COZAAR) 25 MG tablet Take 1 tablet (25 mg total) by mouth daily. 08/09/20 11/07/20  Claiborne Rigg, NP  omeprazole (PRILOSEC) 20 MG capsule TAKE 1 CAPSULE (20 MG TOTAL) BY MOUTH DAILY FOR 28 DAYS. 06/15/20 06/15/21  Placido Sou, PA-C  predniSONE (DELTASONE) 20 MG tablet Take 2 tablets  (40 mg total) by mouth daily. 12/02/21   Mardella Layman, MD      Allergies    Shellfish allergy, Contrast media [iodinated contrast media], and Lactose intolerance (gi)    Review of Systems   Review of Systems  Cardiovascular:  Positive for palpitations.  All other systems reviewed and are negative.   Physical Exam Updated Vital Signs BP (!) 109/94 (BP Location: Right Arm)   Pulse (!) 175   Temp 97.6 F (36.4 C)   Resp 19   Ht 5\' 7"  (1.702 m)   Wt 100 kg   SpO2 100%   BMI 34.53 kg/m  Physical Exam Vitals and nursing note reviewed.  Constitutional:      General: She is in acute distress.     Appearance: She is well-developed.  HENT:     Head: Normocephalic and atraumatic.  Cardiovascular:     Rate and Rhythm: Regular rhythm. Tachycardia present.     Heart sounds: No murmur heard. Pulmonary:     Effort: Pulmonary effort is normal. No respiratory distress.     Breath sounds: Normal breath sounds.  Abdominal:     Palpations: Abdomen is soft.     Tenderness: There is no abdominal tenderness. There is no guarding or rebound.  Musculoskeletal:        General: No swelling or tenderness.  Skin:    General: Skin is warm and dry.  Neurological:     Mental Status: She is alert and oriented to person, place, and time.  Psychiatric:        Behavior: Behavior normal.     ED Results / Procedures / Treatments   Labs (all labs ordered are listed, but only abnormal results are displayed) Labs Reviewed  COMPREHENSIVE METABOLIC PANEL  CBC WITH DIFFERENTIAL/PLATELET  MAGNESIUM  TSH  TROPONIN I (HIGH SENSITIVITY)    EKG EKG Interpretation Date/Time:  Tuesday March 12 2023 06:45:37 EST Ventricular Rate:  63 PR Interval:  148 QRS Duration:  80 QT Interval:  373 QTC Calculation: 382 R Axis:   79  Text Interpretation: Sinus rhythm Repol abnrm, severe global ischemia (LM/MVD) Confirmed by Tilden Fossa [phone]) on 03/12/2023 6:51:27 AM  Radiology No results  found.  Procedures Procedures   Angiocath insertion Performed by: Tilden Fossa  Consent: Verbal consent obtained. Risks and benefits: risks, benefits and alternatives were discussed Time out: Immediately prior to procedure a "time out" was called to verify the correct patient, procedure, equipment, support staff and site/side marked as required.  Preparation: Patient was prepped and draped in the usual sterile fashion.  Vein Location: right forearm  Ultrasound Guided  Gauge: 20  Normal blood return and flush without difficulty Patient tolerance: Patient tolerated the procedure well with no immediate complications.   Medications Ordered in ED Medications  adenosine (ADENOCARD) 6 MG/2ML injection 6 mg (6 mg Intravenous Given 03/12/23 6045)    ED Course/ Medical Decision Making/ A&P                                 Medical Decision Making Amount and/or Complexity of Data Reviewed Labs: ordered. Radiology: ordered.  Risk Prescription drug management.   Patient here for evaluation palpitations.  She was in SVT at time of ED arrival.  Multiple attempts at IV access were unsuccessful.  We were able to establish an IV in the right forearm.  Attempts at administration of adenosine and the IV infiltrated.  While attempting another IV patient converted to sinus rhythm.  Patient care transferred pending reevaluation, labs.        Final Clinical Impression(s) / ED Diagnoses Final diagnoses:  None    Rx / DC Orders ED Discharge Orders     None         Tilden Fossa, MD 03/12/23 [phone]

## 2023-03-12 NOTE — Progress Notes (Signed)
 IVT consult placed for additional IV access for CTA.  CTA scheduled for tomorrow (2/26 @ 1430). Given patient has a history with this admission of being a difficult stick and infiltration of adenosine to right FA, recommending waiting until closer to time of scan to place IV.  This will give more time for infiltration to decrease and reduce the risk of line becoming dislodged over night. Pt currently with USGPIV 20g in left FA/AC.

## 2023-03-12 NOTE — Progress Notes (Signed)
 we are going to do contrasted CCTA for this patient tomorrow 03/13/23 at 2:30p. she is allergic to contrast media meaning we have ordered 13 hr prep (prednisone + benadryl starting 13 hr prior to contrast administration). this will start tonight / in the early morning at 1:30a. its important to keep this schedule for best results to mitigate allergic reaction.   theres also a dose of metoprolol for HR control (target HR 55-65bpm). this will need to be given 2 hr prior to scan. please hold all other stimulants and caffeine for 12 hrs prior to scan. an 18g peripheral IV is also needed in the [facility] or higher for the contrast injection. thank you!   Rockwell Alexandria RN Navigator Cardiac Imaging [facility] [phone] Office  [phone] Cell

## 2023-03-12 NOTE — ED Notes (Signed)
 Lab called a critical troponin 482 at 0939 and Dr Adela Lank was made aware of result.

## 2023-03-12 NOTE — ED Notes (Signed)
 Difficulty obtaining IV access. MD at bedside.

## 2023-03-12 NOTE — Progress Notes (Signed)
*  PRELIMINARY RESULTS* Echocardiogram 2D Echocardiogram has been performed.  Laddie Aquas 03/12/2023, 10:49 AM

## 2023-03-12 NOTE — ED Triage Notes (Signed)
 Pt BIB [facility] from home. EMS called out for palpitations. Palpations started earlier today but resolved, about an hour ago palpitations started again but did not resolve. Hx SVT.   EMS 180-190 P 132/86 BP 98% RA, [place] for comfort

## 2023-03-13 ENCOUNTER — Observation Stay (HOSPITAL_BASED_OUTPATIENT_CLINIC_OR_DEPARTMENT_OTHER): Payer: Medicaid Other

## 2023-03-13 DIAGNOSIS — R7989 Other specified abnormal findings of blood chemistry: Secondary | ICD-10-CM | POA: Diagnosis not present

## 2023-03-13 DIAGNOSIS — I471 Supraventricular tachycardia, unspecified: Secondary | ICD-10-CM | POA: Diagnosis not present

## 2023-03-13 LAB — HIV ANTIBODY (ROUTINE TESTING W REFLEX): HIV Screen 4th Generation wRfx: NONREACTIVE

## 2023-03-13 LAB — CBC
HCT: 42.8 % (ref 36.0–46.0)
Hemoglobin: 13.6 g/dL (ref 12.0–15.0)
MCH: 27.4 pg (ref 26.0–34.0)
MCHC: 31.8 g/dL (ref 30.0–36.0)
MCV: 86.1 fL (ref 80.0–100.0)
Platelets: 286 10*3/uL (ref 150–400)
RBC: 4.97 MIL/uL (ref 3.87–5.11)
RDW: 13.5 % (ref 11.5–15.5)
WBC: 5.6 10*3/uL (ref 4.0–10.5)
nRBC: 0 % (ref 0.0–0.2)

## 2023-03-13 LAB — BASIC METABOLIC PANEL
Anion gap: 10 (ref 5–15)
BUN: 6 mg/dL (ref 6–20)
CO2: 25 mmol/L (ref 22–32)
Calcium: 9.4 mg/dL (ref 8.9–10.3)
Chloride: 102 mmol/L (ref 98–111)
Creatinine, Ser: 0.79 mg/dL (ref 0.44–1.00)
GFR, Estimated: >60 mL/min (ref >60)
Glucose, Bld: 125 mg/dL — ABNORMAL HIGH (ref 70–99)
Potassium: 3.9 mmol/L (ref 3.5–5.1)
Sodium: 137 mmol/L (ref 135–145)

## 2023-03-13 LAB — TSH: TSH: 0.769 u[IU]/mL (ref 0.350–4.500)

## 2023-03-13 MED ORDER — METOPROLOL TARTRATE 5 MG/5ML IV SOLN
INTRAVENOUS | Status: AC
  Filled 2023-03-13: qty 5

## 2023-03-13 MED ORDER — ONDANSETRON HCL 4 MG/2ML IJ SOLN
4.0000 mg | Freq: Once | INTRAMUSCULAR | Status: AC
  Administered 2023-03-13: 4 mg via INTRAVENOUS
  Filled 2023-03-13: qty 2

## 2023-03-13 MED ORDER — ROSUVASTATIN CALCIUM 20 MG PO TABS
20.0000 mg | ORAL_TABLET | Freq: Every day | ORAL | Status: DC
  Administered 2023-03-13: 20 mg via ORAL
  Filled 2023-03-13: qty 1

## 2023-03-13 MED ORDER — CARVEDILOL 3.125 MG PO TABS: 6.2500 mg | ORAL_TABLET | Freq: Two times a day (BID) | ORAL | 0 refills | Status: DC

## 2023-03-13 MED ORDER — ACETAMINOPHEN 325 MG PO TABS
650.0000 mg | ORAL_TABLET | Freq: Four times a day (QID) | ORAL | Status: DC | PRN
  Administered 2023-03-13: 650 mg via ORAL
  Filled 2023-03-13: qty 2

## 2023-03-13 MED ORDER — NITROGLYCERIN 0.4 MG SL SUBL
SUBLINGUAL_TABLET | SUBLINGUAL | Status: AC
  Filled 2023-03-13: qty 2

## 2023-03-13 MED ORDER — METOPROLOL TARTRATE 100 MG PO TABS
100.0000 mg | ORAL_TABLET | Freq: Once | ORAL | Status: AC
  Administered 2023-03-13: 100 mg via ORAL
  Filled 2023-03-13: qty 1

## 2023-03-13 MED ORDER — NITROGLYCERIN 0.4 MG SL SUBL
0.8000 mg | SUBLINGUAL_TABLET | Freq: Once | SUBLINGUAL | Status: AC
  Administered 2023-03-13: 0.8 mg via SUBLINGUAL

## 2023-03-13 MED ORDER — ROSUVASTATIN CALCIUM 20 MG PO TABS: 20.0000 mg | ORAL_TABLET | Freq: Every day | ORAL | 0 refills | Status: AC

## 2023-03-13 MED ORDER — IOHEXOL 350 MG/ML SOLN
95.0000 mL | Freq: Once | INTRAVENOUS | Status: AC | PRN
  Administered 2023-03-13: 95 mL via INTRAVENOUS

## 2023-03-13 NOTE — Progress Notes (Incomplete)
 Internal Medicine Attending:  I personally saw and examined the patient. Discussed with the resident and agree with the resident's findings and plan of care as documented in the resident's note. For calcium scoring (hopefully today).  Troponin appears to have peaked.  Encouraged tobacco cessation (1/4ppd) D/c planning based on this.  Appreciate CV f/u.  Needs pcp.   Ginnie Smart, MD

## 2023-03-13 NOTE — Progress Notes (Addendum)
 Patient Name: Joyce Peters Date of Encounter: 03/13/2023 [facility] Cardiologist: Bryan Lemma, MD   Interval Summary  .    Patient resting comfortably in bed, reports feeling much better compared to yesterday Denies any chest pain, shortness of breath or palpitations overnight HR has been 60-80s without any evidence of breakthrough SVT overnight  She is a little tired having not slept that well but otherwise feels pretty good.  No further chest pain or palpitations.  Vital Signs .    Vitals:   03/12/23 2328 03/13/23 0000 03/13/23 0400 03/13/23 0421  BP:    (!) 149/86  Pulse:    75  Resp:    16  Temp:    98.4 F (36.9 C)  TempSrc:    Oral  SpO2: 99% 97% 95% 98%  Weight:      Height:       No intake or output data in the 24 hours ending 03/13/23 0820    03/12/2023    7:36 AM 03/12/2023    6:09 AM 03/02/2023    3:42 AM  Last 3 Weights  Weight (lbs) 230 lb 220 lb 7.4 oz 220 lb  Weight (kg) 104.327 kg 100 kg 99.791 kg     Telemetry/ECG    Sinus rhythm, HR 60-80s - Personally Reviewed  Physical Exam .   GEN: No acute distress.   Neck: No JVD Cardiac: RRR, no murmurs, rubs, or gallops.  Respiratory: Clear to auscultation bilaterally.  Nonlabored GI: Soft, nontender, non-distended  MS: No edema  Assessment & Plan .     Paroxysmal SVT Presented to ED 2/25 with palpitations, had been experiencing episodes x 3 weeks  Converted to NSR with adenosine 6 mg given in ED TSH normal Echo showed LVEF 60-65%, no RWMA, mild LVH, normal RV  Continue CoReg 3.125 mg BID (chosen for both rate control and BP.) Referral to EP outpatient, may consider ablation if symptoms persist while on beta blocker    Elevated troponin  482 > 1,237 > 681 Coronary CTA from 10/2019 showed no CAD Experienced shortness of breath and palpitations during SVT episodes but no chest pain Risk factors include: active smoker, hypertension, family history of CAD, elevated A1C (6%), elevated  LDL (160) Troponin elevation likely secondary to demand ischemia but with risk factors will evaluate coronary CTA today to evaluate for ischemia  Coronary CTA ordered for today, prednisone and benadryl ordered for contrast allergy.  Continue CoReg and give Lopressor 100 mg prior to study  Pending results of coronary CTA patient may be able to discharge home   she has an IV in place ready for the CT scan.  Currently getting premedicated.  Tentative plan is for roughly 2 PM.  Hypertension Most recent BP 149/86; HR 87 Continue CoReg 3.125 mg BID Consider increasing dose tomorrow/at discharge since patient will be receiving Lopressor 100 mg prior to coronary CTA today => would recommend increasing to 6.25 mg twice daily for discharge.  Hyperlipidemia  Lipid panel 2/26 showed: total 224, LDL 160, trigs 84, HDL 47 LFTs normal  Start Crestor 20 mg daily  Recheck labs in 4-6 weeks   For questions or updates, please contact [facility] Please consult [email or web address] for contact info under       Signed, Olena Leatherwood, PA-C   ATTENDING ATTESTATION  I have seen, examined and evaluated the patient this morning on rounds along with Evlyn Clines, PA.  After reviewing all the available data and chart, we discussed  the patients laboratory, study & physical findings as well as symptoms in detail.  I agree with her findings, examination as well as impression recommendations as per our discussion.    Attending adjustments noted in italics.  Thankfully, no further breakthrough spells a bit of SVT.  Sizable troponin elevation warrants ischemic evaluation and she has done well in the past with Coronary CTA.  Continue to monitor.  Based on results, anticipate potential discharge if no CAD noted.  I agree with statin.   ADDENDUM:  Was notified by Dr. Rennis Golden who preliminary read this Coronary CTA as being negative.  No evidence of CAD.  Patient to be stable for discharge.  We will arrange  outpatient follow-up with cardiology. Continue beta-blocker and statin.    Marykay Lex, MD, MS Bryan Lemma, M.D., M.S. Interventional Cardiologist  [facility]  Pager # [phone] Phone # [phone] [address] [place], [place] [address]

## 2023-03-13 NOTE — Discharge Summary (Signed)
 Name: Joyce Peters MRN: [id number] DOB: 1970-03-14 53 y.o. PCP: Pcp, No  Date of Admission: 03/12/2023  6:01 AM Date of Discharge:  03/13/23 Attending Physician: Dr.  Ninetta Lights  DISCHARGE DIAGNOSIS:  Primary Problem: SVT (supraventricular tachycardia) Joyce Peters)   Hospital Problems: Principal Problem:   SVT (supraventricular tachycardia) (HCC) Active Problems:   Elevated troponin    DISCHARGE MEDICATIONS:   Allergies as of 03/13/2023       Reactions   Shellfish Allergy Anaphylaxis   Contrast Media [iodinated Contrast Media]    Pork-derived Products    Religious beliefs        Medication List     TAKE these medications    acetaminophen 500 MG tablet Commonly known as: TYLENOL Take 500 mg by mouth every 6 (six) hours as needed for mild pain.   ASHWAGANDHA PO Take 1 capsule by mouth daily.   carvedilol 3.125 MG tablet Commonly known as: COREG Take 2 tablets (6.25 mg total) by mouth 2 (two) times daily with a meal.   rosuvastatin 20 MG tablet Commonly known as: CRESTOR Take 1 tablet (20 mg total) by mouth daily. Start taking on: March 14, 2023        DISPOSITION AND FOLLOW-UP:  Joyce Peters was discharged from [facility] in stable condition. At the hospital follow up visit please address:  Follow-up Recommendations: Consults: EP Labs:  None Studies: None Medications: Crestor, Coreg   HOSPITAL COURSE:  Patient Summary: #Paroxysmal SVT Presented with palpitations and episodes for the previous 3 weeks.  Converted to normal sinus rhythm with adenosine.  TSH within normal limits.  Started on Coreg.  Consider referral to EP outpatient for possible ablation if systems are persistent despite beta-blocker treatment. Scheduled for follow-up with cardiology.   #NSTEMI Tropes elevated from [id number].  Cardiology consulted.  Likely secondary to demand ischemia.  Echo notable for mild concentric left ventricular hypertrophy but otherwise  unremarkable. CTA was normal.  Stable at discharge.  #Hypertension Elevated with systolic in the 140s.  Will be discharged on Coreg 6.25 mg twice daily.  Follow-up outpatient  #Hyperlipidemia LDL elevated at 160.  Started on Crestor 20 mg daily.  Follow-up lipid panel in 4 to 6 weeks  #Tobacco use disorder  Patient endorses smoking 4 to 5 cigarettes/day.  Follow-up outpatient.   DISCHARGE INSTRUCTIONS:   Discharge Instructions     Call MD for:  difficulty breathing, headache or visual disturbances   Complete by: As directed    Call MD for:  extreme fatigue   Complete by: As directed    Call MD for:  hives   Complete by: As directed    Call MD for:  persistant dizziness or light-headedness   Complete by: As directed    Call MD for:  persistant nausea and vomiting   Complete by: As directed    Call MD for:  redness, tenderness, or signs of infection (pain, swelling, redness, odor or green/yellow discharge around incision site)   Complete by: As directed    Call MD for:  severe uncontrolled pain   Complete by: As directed    Call MD for:  temperature >100.4   Complete by: As directed    Diet - low sodium heart healthy   Complete by: As directed    Discharge instructions   Complete by: As directed    Joyce Peters, Joyce Peters were hospitalized for an elevated heart rate.  You were given medications to normalize your heart rate.  At this time,  we feel that you are safe for discharge.  Please *START* taking the following medications: - Coreg 6.25 mg twice daily - Crestor 20 mg daily  Please continue taking your other medications as prescribed.  You have a hospital follow-up appointment with me on 03/26/2023 at 2:45 PM.  If you have any questions, please call us at [phone].  We are glad that you are feeling better!   Increase activity slowly   Complete by: As directed        SUBJECTIVE:  Patient was evaluated at bedside.  Denies any chest pain, palpitations, shortness of  breath, dizziness, or any other signs or symptoms. Discharge Vitals:   BP (!) 155/87   Pulse 69   Temp 98.1 F (36.7 C) (Oral)   Resp 16   Ht 5\' 7"  (1.702 m)   Wt 104.3 kg   SpO2 98%   BMI 36.02 kg/m   OBJECTIVE:  Physical Exam Constitutional:      Appearance: Normal appearance.  HENT:     Head: Normocephalic and atraumatic.  Cardiovascular:     Rate and Rhythm: Normal rate and regular rhythm.  Pulmonary:     Effort: Pulmonary effort is normal.     Breath sounds: Normal breath sounds.  Abdominal:     General: Abdomen is flat. Bowel sounds are normal.     Palpations: Abdomen is soft.  Skin:    General: Skin is warm.     Capillary Refill: Capillary refill takes less than 2 seconds.  Neurological:     General: No focal deficit present.     Mental Status: She is alert.     Pertinent Labs, Studies, and Procedures:     Latest Ref Rng & Units 03/13/2023    5:11 AM 03/12/2023    8:14 AM 03/02/2023    3:52 AM  CBC  WBC 4.0 - 10.5 K/uL 5.6  6.0  6.6   Hemoglobin 12.0 - 15.0 g/dL 09.8  11.9  14.7   Hematocrit 36.0 - 46.0 % 42.8  38.8  37.7   Platelets 150 - 400 K/uL 286  302  275       Latest Ref Rng & Units 03/13/2023    5:11 AM 03/12/2023    8:14 AM 03/02/2023    3:52 AM  CMP  Glucose 70 - 99 mg/dL 829  562  130   BUN 6 - 20 mg/dL 6  12  13    Creatinine 0.44 - 1.00 mg/dL 8.65  7.84  6.96   Sodium 135 - 145 mmol/L 137  143  143   Potassium 3.5 - 5.1 mmol/L 3.9  3.5  3.6   Chloride 98 - 111 mmol/L 102  104  108   CO2 22 - 32 mmol/L 25  28  26    Calcium 8.9 - 10.3 mg/dL 9.4  9.2  9.0   Total Protein 6.5 - 8.1 g/dL  6.8    Total Bilirubin 0.0 - 1.2 mg/dL  0.5    Alkaline Phos 38 - 126 U/L  56    AST 15 - 41 U/L  25    ALT 0 - 44 U/L  21     ECHOCARDIOGRAM COMPLETE Result Date: 03/12/2023    ECHOCARDIOGRAM REPORT   Patient Name:   Joyce Peters Date of Exam: 03/12/2023 Medical Rec #:  [id number]        Height:       67.0 in Accession #:    [id number]  Weight:        230.0 lb Date of Birth:  [birth year]         BSA:          2.146 m Patient Age:    52 years         BP:           150/72 mmHg Patient Gender: F                HR:           73 bpm. Exam Location:  Inpatient Procedure: 2D Echo (Both Spectral and Color Flow Doppler were utilized during            procedure). Indications:    Elevated Troponin  History:        Patient has prior history of Echocardiogram examinations, most                 recent 10/20/2019. Arrythmias:SVT, Signs/Symptoms:Chest Pain;                 Risk Factors:Current Smoker.  Sonographer:    Dondra Prader RVT RCS Referring Phys: [id number] DAN FLOYD IMPRESSIONS  1. Left ventricular ejection fraction, by estimation, is 60 to 65%. The left ventricle has normal function. The left ventricle has no regional wall motion abnormalities. There is mild concentric left ventricular hypertrophy. Left ventricular diastolic parameters were normal.  2. Right ventricular systolic function is normal. The right ventricular size is normal.  3. The mitral valve is normal in structure. No evidence of mitral valve regurgitation. No evidence of mitral stenosis.  4. The aortic valve is tricuspid. Aortic valve regurgitation is not visualized. No aortic stenosis is present.  5. The inferior vena cava is normal in size with greater than 50% respiratory variability, suggesting right atrial pressure of 3 mmHg. FINDINGS  Left Ventricle: Left ventricular ejection fraction, by estimation, is 60 to 65%. The left ventricle has normal function. The left ventricle has no regional wall motion abnormalities. Strain imaging was not performed. The left ventricular internal cavity  size was normal in size. There is mild concentric left ventricular hypertrophy. Left ventricular diastolic parameters were normal. Right Ventricle: The right ventricular size is normal. No increase in right ventricular wall thickness. Right ventricular systolic function is normal. Left Atrium: Left atrial size was normal  in size. Right Atrium: Right atrial size was normal in size. Pericardium: There is no evidence of pericardial effusion. Mitral Valve: The mitral valve is normal in structure. No evidence of mitral valve regurgitation. No evidence of mitral valve stenosis. Tricuspid Valve: The tricuspid valve is normal in structure. Tricuspid valve regurgitation is trivial. No evidence of tricuspid stenosis. Aortic Valve: The aortic valve is tricuspid. Aortic valve regurgitation is not visualized. No aortic stenosis is present. Aortic valve mean gradient measures 3.0 mmHg. Aortic valve peak gradient measures 5.8 mmHg. Aortic valve area, by VTI measures 2.45 cm. Pulmonic Valve: The pulmonic valve was normal in structure. Pulmonic valve regurgitation is trivial. No evidence of pulmonic stenosis. Aorta: The aortic root is normal in size and structure. Venous: The inferior vena cava is normal in size with greater than 50% respiratory variability, suggesting right atrial pressure of 3 mmHg. IAS/Shunts: No atrial level shunt detected by color flow Doppler. Additional Comments: 3D imaging was not performed.  LEFT VENTRICLE PLAX 2D LVIDd:         3.60 cm   Diastology LVIDs:         2.40 cm  LV e' medial:    5.03 cm/s LV PW:         1.20 cm   LV E/e' medial:  13.5 LV IVS:        1.50 cm   LV e' lateral:   10.20 cm/s LVOT diam:     1.90 cm   LV E/e' lateral: 6.7 LV SV:         62 LV SV Index:   29 LVOT Area:     2.84 cm  RIGHT VENTRICLE             IVC RV Basal diam:  3.10 cm     IVC diam: 1.30 cm RV Mid diam:    2.60 cm RV S prime:     11.20 cm/s TAPSE (M-mode): 2.7 cm LEFT ATRIUM             Index        RIGHT ATRIUM           Index LA diam:        3.70 cm 1.72 cm/m   RA Area:     12.80 cm LA Vol (A2C):   57.1 ml 26.61 ml/m  RA Volume:   29.80 ml  13.89 ml/m LA Vol (A4C):   47.2 ml 21.97 ml/m LA Biplane Vol: 59.5 ml 27.73 ml/m  AORTIC VALVE                    PULMONIC VALVE AV Area (Vmax):    2.41 cm     PV Vmax:       0.94 m/s AV  Area (Vmean):   2.37 cm     PV Peak grad:  3.5 mmHg AV Area (VTI):     2.45 cm AV Vmax:           120.00 cm/s AV Vmean:          81.300 cm/s AV VTI:            0.253 m AV Peak Grad:      5.8 mmHg AV Mean Grad:      3.0 mmHg LVOT Vmax:         102.00 cm/s LVOT Vmean:        68.000 cm/s LVOT VTI:          0.219 m LVOT/AV VTI ratio: 0.87  AORTA Ao Root diam: 2.80 cm Ao Asc diam:  3.50 cm MITRAL VALVE MV Area (PHT): 4.19 cm    SHUNTS MV Decel Time: 181 msec    Systemic VTI:  0.22 m MV E velocity: 68.00 cm/s  Systemic Diam: 1.90 cm MV A velocity: 77.40 cm/s MV E/A ratio:  0.88 Clearnce Hasten Electronically signed by Clearnce Hasten Signature Date/Time: 03/12/2023/10:52:33 AM    Final    DG Chest Port 1 View Result Date: 03/12/2023 CLINICAL DATA:  Chest pain.  Supraventricular tachycardia. EXAM: PORTABLE CHEST 1 VIEW COMPARISON:  Chest radiograph dated 07/26/2020. FINDINGS: Support wires overlie the patient. No focal consolidation, pleural effusion, or pneumothorax. The cardiac silhouette is within normal limits. No acute osseous pathology. IMPRESSION: No active disease. Electronically Signed   By: Elgie Collard M.D.   On: 03/12/2023 10:18    Signed: Morrie Sheldon, MD Internal Medicine Resident, PGY-1 [facility]  Pager: [phone]

## 2023-03-13 NOTE — Hospital Course (Addendum)
#  Paroxysmal SVT Presented with palpitations and episodes for the previous 3 weeks.  Converted to normal sinus rhythm with adenosine.  TSH within normal limits.  Started on Coreg.  Consider referral to EP outpatient for possible ablation if systems are persistent despite beta-blocker treatment. Scheduled for follow-up with cardiology.   #NSTEMI Tropes elevated from [phone].  Cardiology consulted.  Likely secondary to demand ischemia.  Echo notable for mild concentric left ventricular hypertrophy but otherwise unremarkable. CTA was normal.  Stable at discharge.  #Hypertension Elevated with systolic in the 140s.  Will be discharged on Coreg 6.25 mg twice daily.  Follow-up outpatient  #Hyperlipidemia LDL elevated at 160.  Started on Crestor 20 mg daily.  Follow-up lipid panel in 4 to 6 weeks  #Tobacco use disorder  Patient endorses smoking 4 to 5 cigarettes/day.  Follow-up outpatient.

## 2023-03-13 NOTE — TOC CM/SW Note (Signed)
 Transition of Care [facility]) - Inpatient Brief Assessment   Patient Details  Name: Joyce Peters MRN: [id number] Date of Birth: March 10, 1970  Transition of Care [facility]) CM/SW Contact:    Gala Lewandowsky, RN Phone Number: 03/13/2023, 3:11 PM   Clinical Narrative: Patient presented for palpitations. PTA patient was from home with family support. Patient has been established with PCP via the Internal Medicine-information is on the AVS. Case Manager provided patient with Medicaid Transportation recourses. No further needs identified at this time.    Transition of Care Asessment: Insurance and Status: Insurance coverage has been reviewed Patient has primary care physician: Yes (Appointment has been established with Internal Medicine) Home environment has been reviewed: reviewed Prior level of function:: independent Prior/Current Home Services: No current home services Social Drivers of Health Review: SDOH reviewed needs interventions Readmission risk has been reviewed: Yes Transition of care needs: no transition of care needs at this time

## 2023-03-18 ENCOUNTER — Other Ambulatory Visit: Payer: Self-pay | Admitting: Student

## 2023-03-26 ENCOUNTER — Ambulatory Visit: Payer: Medicaid Other | Admitting: Student

## 2023-03-26 ENCOUNTER — Telehealth: Payer: Self-pay | Admitting: *Deleted

## 2023-03-26 VITALS — BP 142/76 | HR 82 | Temp 97.9°F | Ht 67.0 in | Wt 233.4 lb

## 2023-03-26 DIAGNOSIS — Z720 Tobacco use: Secondary | ICD-10-CM | POA: Insufficient documentation

## 2023-03-26 DIAGNOSIS — F17210 Nicotine dependence, cigarettes, uncomplicated: Secondary | ICD-10-CM

## 2023-03-26 DIAGNOSIS — Z0000 Encounter for general adult medical examination without abnormal findings: Secondary | ICD-10-CM | POA: Insufficient documentation

## 2023-03-26 DIAGNOSIS — F419 Anxiety disorder, unspecified: Secondary | ICD-10-CM | POA: Insufficient documentation

## 2023-03-26 DIAGNOSIS — I471 Supraventricular tachycardia, unspecified: Secondary | ICD-10-CM | POA: Diagnosis not present

## 2023-03-26 DIAGNOSIS — Z1211 Encounter for screening for malignant neoplasm of colon: Secondary | ICD-10-CM

## 2023-03-26 DIAGNOSIS — Z1231 Encounter for screening mammogram for malignant neoplasm of breast: Secondary | ICD-10-CM

## 2023-03-26 MED ORDER — HYDROXYZINE HCL 10 MG PO TABS: 10.0000 mg | ORAL_TABLET | Freq: Every evening | ORAL | 0 refills | Status: AC | PRN

## 2023-03-26 MED ORDER — NICOTINE POLACRILEX 2 MG MT GUM: 2.0000 mg | CHEWING_GUM | OROMUCOSAL | 3 refills | Status: AC | PRN

## 2023-03-26 NOTE — Progress Notes (Deleted)
  Cardiology Office Note   Date:  03/26/2023  ID:  Kameelah, Minish 1970-07-09, MRN [id number] PCP:  [facility] Cardiologist: Bryan Lemma, MD  Reason for visit: Hospital follow-up  History of Present Illness    Joyce Peters is a 53 y.o. female with a hx of SVT.  Patient's cardiac risk factors include: active smoker, hypertension, family history of CAD, elevated A1C (6%), elevated LDL (160).   She went to [facility] health ED March 02, 2023 with palpitations, relieved by 6 mg adenosine by EMS.  ED EKG showed normal sinus rhythm with heart rate 90.  Patient was then asymptomatic.  Referred to cardiology.  Patient returned to the hospital February 25 through February 26.  She presented for palpitations x 3 weeks.  She again converted to normal sinus rhythm with adenosine.  Started on carvedilol.  Troponin elevated thought likely secondary to demand ischemia.  Echo showed mild LVH, otherwise normal.  CTA of the coronaries normal.  Blood pressure in the 140s, discharged on Coreg 6.25 mg twice daily.  With elevated LDL of 160, started on Crestor 20 mg daily.  Tobacco cessation recommended as patient was smoking 4 to 5 cigarettes/day.  -EP referral -health coach?   Today, ***  Paroxysmal SVT -Presented to the hospital twice in February with SVT, converted with adenosine.  Started on carvedilol. -Echo showed LVEF 60-65%, no RWMA, mild LVH, normal RV  -***  Hypertension -*** -Goal BP is <130/80.  Recommend DASH diet (high in vegetables, fruits, low-fat dairy products, whole grains, poultry, fish, and nuts and low in sweets, sugar-sweetened beverages, and red meats), salt restriction and increase physical activity.  Hyperlipidemia -LDL 160 in February 2025, started on Crestor 20 mg daily. -*** -Recommend cholesterol lowering diets - Mediterranean diet, DASH diet, vegetarian diet, low-carbohydrate diet and avoidance of trans fats.  Discussed healthier choice  substitutes.  Nuts, high-fiber foods, and fiber supplements may also improve lipids.    Obesity -Even a 5-10% weight loss can have cardiovascular benefits.   -Recommend moderate intensity activity for 30 minutes 5 days/week and the DASH diet.  Tobacco use  -*** -Recommend tobacco cessation.  Reviewed physiologic effects of nicotine and the immediate-eventual benefits of quitting including improvement in cough/breathing and reduction in cardiovascular events.  Discussed quitting tips such as removing triggers and getting support from family/friends and Quitline [address]. -USPSTF recommends one-time screening for abdominal aortic aneurysm (AAA) by ultrasound in men 51 -55 years old who have ever smoked.      Disposition - Follow-up in ***     Objective / Physical Exam   EKG today: ***  Vital signs:  There were no vitals taken for this visit.    GEN: No acute distress NECK: No carotid bruits CARDIAC: ***RRR, no murmurs RESPIRATORY:  Clear to auscultation without rales, wheezing or rhonchi  EXTREMITIES: No edema  Assessment and Plan   ***   {Are you ordering a CV Procedure (e.g. stress test, cath, DCCV, TEE, etc)?   Press F2        :[id number]}    Signed, Bernette Mayers  03/26/2023  [facility]

## 2023-03-26 NOTE — Assessment & Plan Note (Signed)
 Patient has stopped smoking since her hospitalization but is craving it. Willing to try nicotine replacement therapy at this time.   -Nicotine gum

## 2023-03-26 NOTE — Telephone Encounter (Signed)
 Appointment mailed to patient / mammogram appointment March 25.2025 @ 4:30 pm to arrive 4:15 pm / breast center -patient  is aware to contact office if needing to change or cancel appointment if not will be a 75.00 no show charge. Breast center [phone].

## 2023-03-26 NOTE — Patient Instructions (Signed)
 Thank you, Ms.[email or web address] Chovan for allowing [place] to provide your care today.   Referrals ordered today:   Referral Orders         Ambulatory referral to Integrated Behavioral Health         Ambulatory referral to Gastroenterology       I have ordered the following medication/changed the following medications:   Stop the following medications: There are no discontinued medications.   Start the following medications: Meds ordered this encounter  Medications   nicotine polacrilex (NICORETTE) 2 MG gum    Sig: Take 1 each (2 mg total) by mouth as needed for smoking cessation.    Dispense:  100 tablet    Refill:  3   hydrOXYzine (ATARAX) 10 MG tablet    Sig: Take 1 tablet (10 mg total) by mouth at bedtime as needed for anxiety.    Dispense:  30 tablet    Refill:  0     Follow up:  1 month      Remember:    Take 1 tablet (3.125 mg of Coreg in the Morning and two tablet (6.25 mg) in the evening for your blood pressure.  Start counseling for your anxiety  Sleep Hygiene: Go to bed around 10-12AM and sleep for 7-9 hours a night. Do not do stressful things in the bed, get out of your bed if you are awake. Avoid blue light (TV, phones, tablets) within 2 hours of going to bed. Avoid exercise 2 hours prior to going to bed. If you drinking caffeine products (sodas, coffee, teas)  take them at most by 3pm and avoid them later. Open your blinds, have the sun shine through your windows during the day, keep it relaxing at night with dimmer lights.  If really anxious and unable to go to bed you can use Atarax 10mg  at bedtime.    Give [place] a call if you have recurrence of your symptoms that you had during your hospitalization (heart skipping beats or going too fast) and/or you do not hear from your referrals. Follow up with Cardiology tomorrow.   Should you have any questions or concerns please call the internal medicine clinic at [phone].     Manuela Neptune, MD [facility]

## 2023-03-26 NOTE — Assessment & Plan Note (Signed)
 Due for mammo, pap, and colonoscopy. GI and mammography referral sent. She will prepare for pap smear to be done at next visit.

## 2023-03-26 NOTE — Assessment & Plan Note (Signed)
 Was admitted from 03/12/2023 for one day due to palpitation for 3 weeks. Converted to NSR with adenosine. Started on Coreg. TSH WNL. Pt notes she has had increased anxiety and sometimes feels like a lion is chasing her. Tolerating coreg 6.25 in the evening but was having somnolence and was feeling weak after taking coreg in the AM. Stopped taking it in the AM. Has not tried taking only one table in the AM. Today BP is elevated at 142/76, denies worsening CP or palpitations.   -Take 3.125 mg in the Am and 6.25mg  in the PM.  -Consider EP if palpitations continue but ok to CTM for now.  -Anxiety treatment

## 2023-03-26 NOTE — Progress Notes (Signed)
 CC:  Chief Complaint  Patient presents with   Hospitalization Follow-up   HPI:  Joyce Peters is a 53 y.o. female living with a history stated below and presents today for HFU. Please see problem based assessment and plan for additional details.  Past Medical History:  Diagnosis Date   Anxiety    GERD (gastroesophageal reflux disease)    Hypertension    SVT (supraventricular tachycardia) (HCC) 10/20/2019    Current Outpatient Medications on File Prior to Visit  Medication Sig Dispense Refill   acetaminophen (TYLENOL) 500 MG tablet Take 500 mg by mouth every 6 (six) hours as needed for mild pain.     ASHWAGANDHA PO Take 1 capsule by mouth daily.     carvedilol (COREG) 3.125 MG tablet Take 2 tablets (6.25 mg total) by mouth 2 (two) times daily with a meal. 60 tablet 0   rosuvastatin (CRESTOR) 20 MG tablet Take 1 tablet (20 mg total) by mouth daily. 30 tablet 0   No current facility-administered medications on file prior to visit.    Family History  Problem Relation Age of Onset   CAD Mother     Social History   Socioeconomic History   Marital status: Single    Spouse name: Not on file   Number of children: Not on file   Years of education: Not on file   Highest education level: Not on file  Occupational History   Not on file  Tobacco Use   Smoking status: Every Day    Current packs/day: 0.25    Types: Cigarettes   Smokeless tobacco: Never  Vaping Use   Vaping status: Never Used  Substance and Sexual Activity   Alcohol use: Not Currently   Drug use: Not Currently   Sexual activity: Not Currently  Other Topics Concern   Not on file  Social History Narrative   Not on file   Social Drivers of Health   Financial Resource Strain: Not on file  Food Insecurity: No Food Insecurity (03/12/2023)   Hunger Vital Sign    Worried About Running Out of Food in the Last Year: Never true    Ran Out of Food in the Last Year: Never true  Transportation Needs: No  Transportation Needs (03/12/2023)   PRAPARE - Administrator, Civil Service (Medical): No    Lack of Transportation (Non-Medical): No  Physical Activity: Not on file  Stress: Not on file  Social Connections: Not on file  Intimate Partner Violence: Not At Risk (03/12/2023)   Humiliation, Afraid, Rape, and Kick questionnaire    Fear of Current or Ex-Partner: No    Emotionally Abused: No    Physically Abused: No    Sexually Abused: No    Review of Systems: ROS negative except for what is noted on the assessment and plan.  Vitals:   03/26/23 1417  BP: (!) 142/76  Pulse: 82  Temp: 97.9 F (36.6 C)  TempSrc: Oral  SpO2: 100%  Weight: 233 lb 6.4 oz (105.9 kg)  Height: 5\' 7"  (1.702 m)    Physical Exam: Constitutional: well-appearing , in NAD HENT: normocephalic atraumatic, mucous membranes moist Eyes: conjunctiva non-erythematous Cardiovascular: regular rate and rhythm, no m/r/g Pulmonary/Chest: normal work of breathing on room air, lungs clear to auscultation bilaterally Abdominal: soft, non-tender, non-distended MSK: normal bulk and tone, no BLE edema Neurological: alert & oriented x 3, no focal deficit Skin: warm and dry Psych: normal mood and behavior  Assessment & Plan:  Patient discussed with Dr. Sol Blazing  Anxiety Sxs of anxiety have been happening since she came in from the hospital. Was not as pronounced. Taking Ashwaghanda. Keeps her up at night. Her apettite has decreased. Forcing her to eat because she needs to take her meds with food.  More irritable. Feels more tense. More likely adjustment disorder since it has been less than three months but could be that she has overlying anxiety since she mentioned she was anxious prior to this. Has never been on counseling. Provided her with sleep hygiene tips as well.   -Referral to [facility]  -Sleep Hygiene  -Hydroxyzine 10mg  nightly PRN - 30 day supply sent, consider stopping if counseling helping    SVT  (supraventricular tachycardia) (HCC) Was admitted from 03/12/2023 for one day due to palpitation for 3 weeks. Converted to NSR with adenosine. Started on Coreg. TSH WNL. Pt notes she has had increased anxiety and sometimes feels like a lion is chasing her. Tolerating coreg 6.25 in the evening but was having somnolence and was feeling weak after taking coreg in the AM. Stopped taking it in the AM. Has not tried taking only one table in the AM. Today BP is elevated at 142/76, denies worsening CP or palpitations.   -Take 3.125 mg in the Am and 6.25mg  in the PM.  -Consider EP if palpitations continue but ok to CTM for now.  -Anxiety treatment  Tobacco use Patient has stopped smoking since her hospitalization but is craving it. Willing to try nicotine replacement therapy at this time.   -Nicotine gum   Healthcare maintenance Due for mammo, pap, and colonoscopy. GI and mammography referral sent. She will prepare for pap smear to be done at next visit.   Manuela Neptune, MD [facility] Internal Medicine, PGY-1 Phone: [phone] Date 03/26/2023 Time 5:24 PM

## 2023-03-26 NOTE — Assessment & Plan Note (Addendum)
 Sxs of anxiety have been happening since she came in from the hospital. Was not as pronounced. Taking Ashwaghanda. Keeps her up at night. Her apettite has decreased. Forcing her to eat because she needs to take her meds with food.  More irritable. Feels more tense. More likely adjustment disorder since it has been less than three months but could be that she has overlying anxiety since she mentioned she was anxious prior to this. Has never been on counseling. Provided her with sleep hygiene tips as well.   -Referral to [facility]  -Sleep Hygiene  -Hydroxyzine 10mg  nightly PRN - 30 day supply sent, consider stopping if counseling helping

## 2023-03-27 ENCOUNTER — Ambulatory Visit: Payer: Medicaid Other | Admitting: Physician Assistant

## 2023-03-27 DIAGNOSIS — Z720 Tobacco use: Secondary | ICD-10-CM

## 2023-03-27 DIAGNOSIS — I10 Essential (primary) hypertension: Secondary | ICD-10-CM

## 2023-03-27 DIAGNOSIS — I471 Supraventricular tachycardia, unspecified: Secondary | ICD-10-CM

## 2023-03-27 DIAGNOSIS — E785 Hyperlipidemia, unspecified: Secondary | ICD-10-CM

## 2023-03-28 ENCOUNTER — Emergency Department (HOSPITAL_COMMUNITY)
Admission: EM | Admit: 2023-03-28 | Discharge: 2023-03-28 | Disposition: A | Discharge status: Home / Self Care | Attending: Emergency Medicine | Admitting: Emergency Medicine

## 2023-03-28 ENCOUNTER — Emergency Department (HOSPITAL_COMMUNITY)

## 2023-03-28 ENCOUNTER — Other Ambulatory Visit: Payer: Self-pay

## 2023-03-28 DIAGNOSIS — R079 Chest pain, unspecified: Secondary | ICD-10-CM | POA: Diagnosis not present

## 2023-03-28 DIAGNOSIS — R002 Palpitations: Secondary | ICD-10-CM | POA: Diagnosis not present

## 2023-03-28 DIAGNOSIS — R0602 Shortness of breath: Secondary | ICD-10-CM | POA: Diagnosis not present

## 2023-03-28 LAB — CBC
HCT: 38.8 % (ref 36.0–46.0)
Hemoglobin: 12.3 g/dL (ref 12.0–15.0)
MCH: 27.4 pg (ref 26.0–34.0)
MCHC: 31.7 g/dL (ref 30.0–36.0)
MCV: 86.4 fL (ref 80.0–100.0)
Platelets: 300 10*3/uL (ref 150–400)
RBC: 4.49 MIL/uL (ref 3.87–5.11)
RDW: 13.3 % (ref 11.5–15.5)
WBC: 5.4 10*3/uL (ref 4.0–10.5)
nRBC: 0 % (ref 0.0–0.2)

## 2023-03-28 LAB — BASIC METABOLIC PANEL
Anion gap: 11 (ref 5–15)
BUN: 10 mg/dL (ref 6–20)
CO2: 25 mmol/L (ref 22–32)
Calcium: 9.2 mg/dL (ref 8.9–10.3)
Chloride: 105 mmol/L (ref 98–111)
Creatinine, Ser: 0.94 mg/dL (ref 0.44–1.00)
GFR, Estimated: >60 mL/min (ref >60)
Glucose, Bld: 99 mg/dL (ref 70–99)
Potassium: 3.8 mmol/L (ref 3.5–5.1)
Sodium: 141 mmol/L (ref 135–145)

## 2023-03-28 LAB — TROPONIN I (HIGH SENSITIVITY)
Troponin I (High Sensitivity): 4 ng/L (ref <18)
Troponin I (High Sensitivity): 6 ng/L (ref <18)

## 2023-03-28 LAB — TSH: TSH: 1.191 u[IU]/mL (ref 0.350–4.500)

## 2023-03-28 LAB — T4, FREE: Free T4: 0.98 ng/dL (ref 0.61–1.12)

## 2023-03-28 MED ORDER — SIMETHICONE 80 MG PO CHEW: 80.0000 mg | CHEWABLE_TABLET | Freq: Four times a day (QID) | ORAL | 0 refills | Status: AC | PRN

## 2023-03-28 MED ORDER — SIMETHICONE 40 MG/0.6ML PO SUSP
40.0000 mg | Freq: Once | ORAL | Status: AC
  Administered 2023-03-28: 40 mg via ORAL
  Filled 2023-03-28 (×3): qty 0.6

## 2023-03-28 NOTE — ED Notes (Signed)
 Pt requesting pain medication at this time. Message sent to MD.

## 2023-03-28 NOTE — ED Triage Notes (Signed)
 Pt. 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 I just started medication for SVT .about a week ago or more.  Im also SOB

## 2023-03-28 NOTE — ED Provider Triage Note (Signed)
 Emergency Medicine Provider Triage Evaluation Note  Joyce Peters , a 53 y.o. female  was evaluated in triage.  Pt complains of palpitations.  Patient states that she was sitting at work today when she started feeling fluttering in her chest with some chest tightness and shortness of breath.  States this comes about on her diet and goes away on its own as well.  She was recently started on carvedilol, which she does not think is helping.  Review of Systems  Positive: Palpitations, chest pain, shortness of breath Negative:   Physical Exam  BP (!) 158/97   Pulse 75   Temp (!) 97.5 F (36.4 C) (Oral)   Resp 18   SpO2 96%  Gen:   Awake, no distress   Resp:  Normal effort  MSK:   Moves extremities without difficulty  Other:    Medical Decision Making  Medically screening exam initiated at 3:18 PM.  Appropriate orders placed.  [id number] Zenor was informed that the remainder of the evaluation will be completed by another provider, this initial triage assessment does not replace that evaluation, and the importance of remaining in the ED until their evaluation is complete.    Maxwell Marion, PA-C 03/28/23 [phone]

## 2023-03-28 NOTE — ED Provider Notes (Signed)
 [facility] DEPARTMENT AT [facility] Provider Note   CSN: [id number] Arrival date & time: 03/28/23  1438     History  Chief Complaint  Patient presents with   Chest Pain   Palpitations   Shortness of Breath    Joyce Peters is a 53 y.o. female.  History of SVT recently prescribed Crestor and Coreg after admission for NSTEMI x 2 weeks who presents with concern for palpitations and shortness of breath.  Endorses her whole body just feels "bad."  Patient endorses she has been taking her Coreg as prescribed.  She was initially prescribed 2 pills at night, however it was then increased to 2 pills twice a day.  Patient endorses that she has not been feeling well since starting this medication.  Her PCP decreased her morning dose of Coreg to 1 pill, however she is continuing to take 2 pills at night.  While patient was prescribed Crestor at hospital discharge on 2/26, she denies taking it.  Per discharge summary on 03/13/2023, patient was started on Coreg for paroxysmal SVT with consideration of referral to EP outpatient for possible ablation if symptoms are persistent despite beta-blocker treatment.  Patient is scheduled for follow-up with cardiology at the beginning of April.   Chest Pain Associated symptoms: palpitations and shortness of breath   Palpitations Associated symptoms: chest pain and shortness of breath   Shortness of Breath Associated symptoms: chest pain      Home Medications Prior to Admission medications   Medication Sig Start Date End Date Taking? Authorizing Provider  simethicone (MYLICON) 80 MG chewable tablet Chew 1 tablet (80 mg total) by mouth every 6 (six) hours as needed for flatulence. 03/28/23  Yes Renella Cunas, MD  acetaminophen (TYLENOL) 500 MG tablet Take 500 mg by mouth every 6 (six) hours as needed for mild pain.    [provider]  ASHWAGANDHA PO Take 1 capsule by mouth daily.    [provider]  carvedilol  (COREG) 3.125 MG tablet Take 2 tablets (6.25 mg total) by mouth 2 (two) times daily with a meal. 03/13/23   Morrie Sheldon, MD  hydrOXYzine (ATARAX) 10 MG tablet Take 1 tablet (10 mg total) by mouth at bedtime as needed for anxiety. 03/26/23   Alexander-Savino, [place], MD  nicotine polacrilex (NICORETTE) 2 MG gum Take 1 each (2 mg total) by mouth as needed for smoking cessation. 03/26/23   Alexander-Savino, [place], MD  rosuvastatin (CRESTOR) 20 MG tablet Take 1 tablet (20 mg total) by mouth daily. 03/14/23   Morrie Sheldon, MD      Allergies    Shellfish allergy, Contrast media [iodinated contrast media], and Pork-derived products    Review of Systems   Review of Systems  Respiratory:  Positive for shortness of breath.   Cardiovascular:  Positive for chest pain and palpitations.    Physical Exam Updated Vital Signs BP (!) 145/96   Pulse 71   Temp 98 F (36.7 C) (Oral)   Resp 20   Ht 5\' 7"  (1.702 m)   Wt 105.7 kg   SpO2 99%   BMI 36.49 kg/m  Physical Exam Vitals and nursing note reviewed.  Constitutional:      General: She is not in acute distress.    Appearance: Normal appearance. She is not ill-appearing.  HENT:     Head: Normocephalic and atraumatic.     Mouth/Throat:     Lips: Pink. No lesions.     Mouth: Mucous membranes are moist.  Eyes:     Pupils: Pupils are equal, round, and reactive to light.  Cardiovascular:     Rate and Rhythm: Normal rate and regular rhythm.     Pulses:          Radial pulses are 2+ on the right side and 2+ on the left side.     Heart sounds: Normal heart sounds.  Pulmonary:     Effort: Pulmonary effort is normal. No accessory muscle usage.     Breath sounds: Normal breath sounds.  Abdominal:     General: There is no distension.     Palpations: Abdomen is soft.     Tenderness: There is no abdominal tenderness.  Skin:    General: Skin is warm and dry.     Capillary Refill: Capillary refill takes less than 2 seconds.  Neurological:      Mental Status: She is alert.  Psychiatric:        Mood and Affect: Mood is anxious.        Behavior: Behavior is cooperative.    ED Results / Procedures / Treatments   Labs (all labs ordered are listed, but only abnormal results are displayed) Labs Reviewed  BASIC METABOLIC PANEL  CBC  TSH  T4, FREE  TROPONIN I (HIGH SENSITIVITY)  TROPONIN I (HIGH SENSITIVITY)    EKG EKG Interpretation Date/Time:  Thursday March 28 2023 15:02:23 EDT Ventricular Rate:  74 PR Interval:  166 QRS Duration:  72 QT Interval:  386 QTC Calculation: 428 R Axis:   35  Text Interpretation: Normal sinus rhythm Nonspecific ST abnormality Abnormal ECG When compared with ECG of 12-Mar-2023 08:15, PREVIOUS ECG IS PRESENT since last tracing no significant change Confirmed by Eber Hong ([id number]) on 03/28/2023 8:44:12 PM  Radiology DG Chest 2 View Result Date: 03/28/2023 CLINICAL DATA:  Shortness of breath. EXAM: CHEST - 2 VIEW COMPARISON:  03/12/2023 FINDINGS: The cardiomediastinal contours are normal. The lungs are clear. Pulmonary vasculature is normal. No consolidation, pleural effusion, or pneumothorax. No acute osseous abnormalities are seen. IMPRESSION: No active cardiopulmonary disease. Electronically Signed   By: Narda Rutherford M.D.   On: 03/28/2023 19:12    Procedures Procedures    Medications Ordered in ED Medications  simethicone (MYLICON) 40 MG/0.6ML suspension 40 mg (40 mg Oral Given 03/28/23 2255)    ED Course/ Medical Decision Making/ A&P                                 Medical Decision Making Amount and/or Complexity of Data Reviewed Labs: ordered. Radiology: ordered.   Patient's vital signs are reassuring.  She has an appropriate heart rate, respiratory rate, blood pressure, and is satting at 100% on room air.  Physical exam is also reassuring with patient having a regular rate and rhythm and is clear to auscultation bilaterally.  Initial differential includes arrhythmia,  anemia, electrolyte or metabolic derangement, ACS, PE, hyperthyroidism, pneumonia, pleural effusion, pneumothorax  EKG is reassuring for sinus rhythm.  Low suspicion for SVT given patient is in sinus rhythm on EKG as well as on my evaluation.  Labs are reassuring with no electrolyte or metabolic derangements.  No anemia.  I reviewed the CXR which was notable for no focal consolidation concerning for pneumonia, no pneumothorax, no pleural effusion.  Troponin x 2 are negative, and in conjunction with EKG, I have low suspicion for ACS.  Patient is not tachycardic, has no tachypnea, and is  satting well on room air. Patient is not able to [facility] out due to age 81, however Well's is 0 points. Will not pursue as I have a low suspicion for pulmonary embolism based on these criteria.  TSH and T4 WNL.  Will patient was prescribed Crestor at discharge, she has not been taking it, therefore I will not order his EKG and will not pursue a rhabdomyolysis workup.  Advised patient to follow-up with PCP for further manipulation of her Coreg for SVT management.  Patient has AN appointment in early April.  I advised her to keep this appointment and/or follow-up with cardiology sooner if symptoms continue.  On reevaluation prior to discharge, patient with improvement of your symptoms following learning the results, including her negative troponins.  Query whether anxiety from prior NSTEMI playing into patient's palpitations.  Patient denies ongoing symptoms at this time.  Patient does state that she has significant gas and would like a prescription for simethicone.  I have given her prescription for simethicone, though I explained it is OTC.  Patient is felt to be hemodynamically stable for discharge.  Final Clinical Impression(s) / ED Diagnoses Final diagnoses:  Palpitations    Rx / DC Orders ED Discharge Orders          Ordered    simethicone (MYLICON) 80 MG chewable tablet  Every 6 hours PRN        03/28/23 2135            Renella Cunas, PGY-2 Emergency Medicine   Renella Cunas, MD 03/28/23 1610    Eber Hong, MD 03/29/23 2306

## 2023-03-28 NOTE — Discharge Instructions (Signed)
 You presented to the ED today for concern for palpitations.  Your vital signs remained stable, your physical exam was reassuring.  Lab work was reassuring.  Your troponins were 4 and 6.  Your TSH and T4 were normal.  Your chest x-ray was unremarkable.  Your EKG was reassuring.  You may follow-up with your PCP and/your cardiologist for further improvement on your medical regimen.  Continue to stay hydrated and active.

## 2023-04-03 NOTE — Progress Notes (Signed)
 Internal Medicine Clinic Attending  Case discussed with the resident at the time of the visit.  We reviewed the resident's history and exam and pertinent patient test results.  I agree with the assessment, diagnosis, and plan of care documented in the resident's note.

## 2023-04-07 ENCOUNTER — Telehealth: Payer: Self-pay | Admitting: Physician Assistant

## 2023-04-07 MED ORDER — CARVEDILOL 3.125 MG PO TABS: 6.2500 mg | ORAL_TABLET | Freq: Two times a day (BID) | ORAL | 5 refills | Status: DC

## 2023-04-07 NOTE — Telephone Encounter (Signed)
 Pt seen by our service in 02/2023 for Supraventricular Tachycardia. Patient's Rx running out for Coreg. Refill sent to her pharmacy. She has follow up 04/18/23 with Rise Paganini, NP. Tereso Newcomer, PA-C    04/07/2023 8:10 AM

## 2023-04-09 ENCOUNTER — Ambulatory Visit

## 2023-04-16 ENCOUNTER — Encounter: Payer: Self-pay | Admitting: Gastroenterology

## 2023-04-17 ENCOUNTER — Telehealth: Payer: Self-pay | Admitting: Cardiology

## 2023-04-17 NOTE — Telephone Encounter (Signed)
 Received a call from patient she stated she was stung by a bee on right arm this past Saturday.She took a Benadryl around 8:00 am this morning she wanted to make sure ok to take Carvedilol as prescribed.She is having palpitations.Advised ok to take Carvedilol as prescribed.Advised to keep appointment scheduled with Rise Paganini NP 4/3 at 2:00 pm.Advised to bring a list of all medications.

## 2023-04-17 NOTE — Telephone Encounter (Signed)
 Patient c/o Palpitations:  STAT if patient reporting lightheadedness, shortness of breath, or chest pain  How long have you had palpitations/irregular HR/ Afib? Are you having the symptoms now?   Yes  Are you currently experiencing lightheadedness, SOB or CP?   SOB  Do you have a history of afib (atrial fibrillation) or irregular heart rhythm?   Have you checked your BP or HR? (document readings if available):   HR 74 (while on call and has been ranging between 120-160)  Are you experiencing any other symptoms?  Lightheaded   Patient noted she did not take any carvedilol (COREG) 3.125 MG tablet medication this morning.   Patient stated she took Benadryl because she was itchy as she was stung by a bee on Saturday.

## 2023-04-18 ENCOUNTER — Encounter: Payer: Self-pay | Admitting: Emergency Medicine

## 2023-04-18 ENCOUNTER — Ambulatory Visit: Attending: Emergency Medicine | Admitting: Emergency Medicine

## 2023-04-18 VITALS — BP 136/88 | HR 83 | Ht 67.0 in | Wt 227.8 lb

## 2023-04-18 DIAGNOSIS — I471 Supraventricular tachycardia, unspecified: Secondary | ICD-10-CM

## 2023-04-18 DIAGNOSIS — E782 Mixed hyperlipidemia: Secondary | ICD-10-CM

## 2023-04-18 DIAGNOSIS — I10 Essential (primary) hypertension: Secondary | ICD-10-CM

## 2023-04-18 DIAGNOSIS — Z720 Tobacco use: Secondary | ICD-10-CM

## 2023-04-18 MED ORDER — CARVEDILOL 6.25 MG PO TABS: 6.2500 mg | ORAL_TABLET | Freq: Two times a day (BID) | ORAL | 1 refills | Status: DC

## 2023-04-18 NOTE — Progress Notes (Signed)
 Cardiology Office Note:    Date:  04/18/2023  ID:  Joyce Peters, Joyce Peters [birth year], MRN [id number] PCP: Jeral Pinch, DO  [facility] Providers Cardiologist:  Bryan Lemma, MD       Patient Profile:      Chief Complaint: Hospital follow-up for SVT History of Present Illness:  Joyce Peters is a 53 y.o. female with visit-pertinent history of SVT, active smoker, hypertension, family history of CAD, elevated A1C (6%), elevated LDL (160).   She went to [facility] health ED March 02, 2023 with palpitations, relieved by 6 mg adenosine by EMS.  ED EKG showed normal sinus rhythm with heart rate 90.  Patient was then asymptomatic.  Referred to cardiology.  Patient returned to the hospital February 25 through February 26.  She presented for palpitations x 3 weeks.  She had noted recurrent episodes of SVT for 3 weeks.  EKG in ED showing SVT.  She again converted to normal sinus rhythm with adenosine.  Started on carvedilol.  Troponin elevated thought likely secondary to demand ischemia.  Echo showed mild LVH, otherwise normal.  Coronary CTA ordered and completed and showed no evidence of CAD with CAC of 0.  Blood pressure in the 140s, discharged on Coreg 6.25 mg twice daily (chosen for both rate control and BP).  With elevated LDL of 160, started on Crestor 20 mg daily.  Tobacco cessation recommended as patient was smoking 4 to 5 cigarettes/day.   Discussed the use of AI scribe software for clinical note transcription with the patient, who gave verbal consent to proceed.  Today the patient presents for a follow-up after recent hospitalizations due to episodes of SVT. The patient reports feeling extremely tired and having stomach discomfort with the initial dosage of Carvedilol, leading her PCP to adjust the dosage to 3.125 mg in the morning and 6.25 mg in the afternoon.  She notes that this helped her fatigue gradually.  She now feels back at her baseline.  The patient also admits to not taking  the Crestor regularly.   The patient has been monitoring her heart rate using a phone app and reports episodes of increased heart rate, with the highest recorded rate being 155 bpm. These episodes have occurred about five times since the hospital discharge but have been of short duration. The patient also reports a significant improvement in the frequency and intensity of the SVT episodes since starting the Carvedilol. The patient has quit smoking recently.  Review of systems:  Please see the history of present illness. All other systems are reviewed and otherwise negative.     Home Medications:    Current Meds  Medication Sig   acetaminophen (TYLENOL) 500 MG tablet Take 500 mg by mouth every 6 (six) hours as needed for mild pain.   carvedilol (COREG) 6.25 MG tablet Take 1 tablet (6.25 mg total) by mouth 2 (two) times daily with a meal.   hydrOXYzine (ATARAX) 10 MG tablet Take 1 tablet (10 mg total) by mouth at bedtime as needed for anxiety.   rosuvastatin (CRESTOR) 20 MG tablet Take 1 tablet (20 mg total) by mouth daily.   simethicone (MYLICON) 80 MG chewable tablet Chew 1 tablet (80 mg total) by mouth every 6 (six) hours as needed for flatulence.   [DISCONTINUED] carvedilol (COREG) 3.125 MG tablet Take 2 tablets (6.25 mg total) by mouth 2 (two) times daily with a meal.   Studies Reviewed:       Coronary CTA 03/12/2023 1. No evidence of CAD,  CADRADS = 0.   2. Coronary artery calcium score is 0.   3. Normal coronary origin with right dominance.   4. Dilated main pulmonary artery at 32 mm, suggestive of pulmonary hypertension.   5. Consider non-coronary causes of chest pain.  Echocardiogram 03/12/2023 1. Left ventricular ejection fraction, by estimation, is 60 to 65%. The  left ventricle has normal function. The left ventricle has no regional  wall motion abnormalities. There is mild concentric left ventricular  hypertrophy. Left ventricular diastolic  parameters were normal.   2.  Right ventricular systolic function is normal. The right ventricular  size is normal.   3. The mitral valve is normal in structure. No evidence of mitral valve  regurgitation. No evidence of mitral stenosis.   4. The aortic valve is tricuspid. Aortic valve regurgitation is not  visualized. No aortic stenosis is present.   5. The inferior vena cava is normal in size with greater than 50%  respiratory variability, suggesting right atrial pressure of 3 mmHg.   Risk Assessment/Calculations:             Physical Exam:   VS:  BP 136/88   Pulse 83   Ht 5\' 7"  (1.702 m)   Wt 227 lb 12.8 oz (103.3 kg)   SpO2 98%   BMI 35.68 kg/m    Wt Readings from Last 3 Encounters:  04/18/23 227 lb 12.8 oz (103.3 kg)  03/28/23 233 lb (105.7 kg)  03/26/23 233 lb 6.4 oz (105.9 kg)    GEN: Well nourished, well developed in no acute distress NECK: No JVD; No carotid bruits CARDIAC: RRR, no murmurs, rubs, gallops RESPIRATORY:  Clear to auscultation without rales, wheezing or rhonchi  ABDOMEN: Soft, non-tender, non-distended EXTREMITIES:  No edema; No acute deformity     Assessment and Plan:  Paroxysmal SVT Seen in hospital on February 15th and 25th for SVT.  Both times received adenosine with conversion to NSR.  Discharged on carvedilol. Echo during recent mission showed LVEF 60 - 65%, no RWMA, mild LVH, normal RV - Since D/C she has noticed significant improvement in frequency, severity, and duration her SVT episodes.  However she still reports at least 5 episodes where her heart rate has reached up to 155 bpm however dissipated without medicine or vagal maneuvers quickly - She had noted somnolence and fatigue once she began carvedilol 6.25 mg twice daily.  Her PCP recommended her to take carvedilol 3.125 mg in the a.m. and 6.25 mg in the p.m. to improve her fatigue.  Today she notes her fatigue has improved however this was gradual and did not occur immediately after recent medication change. She is back  at her baseline.  - Offered to switch her to metoprolol tartrate however she deferred.  With her ongoing episodes of SVT I will increase her carvedilol back to 6.25 mg twice daily.  If she continues to have fatigue or worsening somnolence and consider changing to metoprolol at that time - Referring to EP today for consideration of ablation given her SVT continues to persist on beta-blocker  Hypertension Blood pressure today is 136/88.  Her blood pressure is slightly elevated above goal of <130/80.  She has not been taking her blood pressure at home.  I encouraged her to begin taking BP at home so we can get her an average daily blood pressure. - She will alert office if blood pressure remains above her goal.  At that time I will consider adding an ARB to her medication  regimen. - Continue Carvedilol 6.25 mg twice daily  Hyperlipidemia LDL 160, TG 84, HDL 47, TC 224 on 02/2023 Recently started on Crestor during recent admission.  However she admits to never starting this medication.  With long discussion over her cholesterol goals given her coronary CTA score is 0.  I would like to see her LDL at least below 100.  She is in agreement to start Crestor today - Start Crestor 20 mg daily - Lipid panel/LFTs in 8 weeks  Tobacco use Admits to no longer using tobacco products - Congratulated her on her continued tobacco cessation     Dispo:  Return in about 6 months (around 10/18/2023).  Signed, Denyce Robert, NP

## 2023-04-18 NOTE — Patient Instructions (Addendum)
 Medication Instructions:  START BACK TAKING YOUR CARVEDILOL 6.25 MG (1 TABLET) IN THE MORNING AND 6.25 MG (1 TABLET) AT NIGHT. START TAKING YOUR ROSUVASTATIN CALCIUM 20 MG DAILY.   Lab Work: FASTING LIPID PANEL AND LFTs IN ABOUT 8 WEEKS AFTER YOU START TAKING YOUR ROSUVASTATIN CALCIUM.   Testing/Procedures: NONE  Follow-Up: At [facility], you and your health needs are our priority.  As part of our continuing mission to provide you with exceptional heart care, our providers are all part of one team.  This team includes your primary Cardiologist (physician) and Advanced Practice Providers or APPs (Physician Assistants and Nurse Practitioners) who all work together to provide you with the care you need, when you need it.  Your next appointment:   6 MONTHS  Provider:   Rise Paganini, DNP   Other  Instructions:  -----------------------------------------------------------------------------------------------------------------------------------------------------------------------------------------------------------------------------------------------------------------------------------------------------------------------------------------------------------------------------------------------------------------------------------------------------------------------------------------------------------------------------------------------------------------------------------------------------------------------------------------------------------------------------------------------------------------------------------------------------------------------------------------------------------------------------------------------------------------------------------------------------------------------------------------------------------------------------------------------------------------------------------------------------------------------------------------------------------------------------------------------------------------------------------------------------------------------------------------------------------------------------------------------------------------------------------------------------------------------------------------------------------------------------------------------------------------------------------------------------------------------------------------------------------------------------------------------------------------------------------------------------EP REFERRAL HAS BEEN SENT.      1st Floor: - Lobby - Registration  - Pharmacy  - Lab - Cafe  2nd Floor: - PV Lab - Diagnostic Testing (echo, CT, nuclear med)  3rd Floor: - Vacant  4th Floor: - TCTS (cardiothoracic surgery) - AFib Clinic - Structural Heart Clinic - Vascular Surgery  - Vascular Ultrasound  5th  Floor: - HeartCare Cardiology (general and EP) - Clinical Pharmacy for coumadin, hypertension, lipid, weight-loss medications, and med management appointments    Valet parking services will be available as well.

## 2023-04-23 ENCOUNTER — Ambulatory Visit (INDEPENDENT_AMBULATORY_CARE_PROVIDER_SITE_OTHER): Payer: Self-pay | Admitting: Licensed Clinical Social Worker

## 2023-04-23 DIAGNOSIS — F419 Anxiety disorder, unspecified: Secondary | ICD-10-CM

## 2023-04-23 NOTE — BH Specialist Note (Signed)
 Patient no-showed today's appointment; appointment was for Telephone visit at 9:00am  Behavioral Health Clinician [facility] from here on out)  attempted patient  via Telephone number [phone]    [facility] contacted patient from telephone number [phone]. [facility] left a  VM.   [facility] will  make a 2nd attempt patient again on 04/15 via telehealth at 1:30 pm.   Christen Butter, MSW, LCSW-A She/Her Behavioral Health Clinician [facility]  Internal Medicine Center Direct [phone]  Fax [phone] Main Office Phone: [phone] [address] [place] [place], [place] [address] Website: [facility] Internal Medicine [facility]  [place], [place]  [place]

## 2023-04-24 ENCOUNTER — Ambulatory Visit: Admitting: Physician Assistant

## 2023-04-30 ENCOUNTER — Ambulatory Visit: Admitting: Licensed Clinical Social Worker

## 2023-04-30 DIAGNOSIS — F419 Anxiety disorder, unspecified: Secondary | ICD-10-CM

## 2023-04-30 NOTE — BH Specialist Note (Signed)
 Patient no-showed today's appointment; appointment was for Telephone visit at 2:00pm  Behavioral Health Clinician [facility] from here on out)  attempted patient  via Telephone .[facility] contacted patient from telephone number [phone].   Patient will need to reschedule appointment by calling Internal medicine center [phone].  Amie Bald, MSW, LCSW-A She/Her Behavioral Health Clinician [facility]  Internal Medicine Center Direct Dial:[phone]  Fax [phone] Main Office Phone: [phone] [address] [place] [place], [place] [address] Website: [facility] Internal Medicine [facility]  [place], [place]  [place]

## 2023-05-06 ENCOUNTER — Telehealth: Payer: Self-pay | Admitting: *Deleted

## 2023-05-06 NOTE — Telephone Encounter (Signed)
 This mammogram appointment was canceled.

## 2023-05-11 ENCOUNTER — Encounter (HOSPITAL_COMMUNITY): Payer: Self-pay | Admitting: Emergency Medicine

## 2023-05-11 ENCOUNTER — Emergency Department (HOSPITAL_COMMUNITY)

## 2023-05-11 ENCOUNTER — Emergency Department (HOSPITAL_COMMUNITY)
Admission: EM | Admit: 2023-05-11 | Discharge: 2023-05-11 | Disposition: A | Discharge status: Home / Self Care | Attending: Emergency Medicine | Admitting: Emergency Medicine

## 2023-05-11 DIAGNOSIS — Z79899 Other long term (current) drug therapy: Secondary | ICD-10-CM | POA: Insufficient documentation

## 2023-05-11 DIAGNOSIS — I471 Supraventricular tachycardia, unspecified: Secondary | ICD-10-CM | POA: Insufficient documentation

## 2023-05-11 DIAGNOSIS — I10 Essential (primary) hypertension: Secondary | ICD-10-CM | POA: Diagnosis not present

## 2023-05-11 DIAGNOSIS — R002 Palpitations: Secondary | ICD-10-CM | POA: Diagnosis present

## 2023-05-11 LAB — CBC WITH DIFFERENTIAL/PLATELET
Abs Immature Granulocytes: 0.01 10*3/uL (ref 0.00–0.07)
Basophils Absolute: 0.1 10*3/uL (ref 0.0–0.1)
Basophils Relative: 1 %
Eosinophils Absolute: 0.2 10*3/uL (ref 0.0–0.5)
Eosinophils Relative: 2 %
HCT: 36.7 % (ref 36.0–46.0)
Hemoglobin: 11.8 g/dL — ABNORMAL LOW (ref 12.0–15.0)
Immature Granulocytes: 0 %
Lymphocytes Relative: 43 %
Lymphs Abs: 3.1 10*3/uL (ref 0.7–4.0)
MCH: 27.6 pg (ref 26.0–34.0)
MCHC: 32.2 g/dL (ref 30.0–36.0)
MCV: 85.9 fL (ref 80.0–100.0)
Monocytes Absolute: 0.7 10*3/uL (ref 0.1–1.0)
Monocytes Relative: 9 %
Neutro Abs: 3.2 10*3/uL (ref 1.7–7.7)
Neutrophils Relative %: 45 %
Platelets: 355 10*3/uL (ref 150–400)
RBC: 4.27 MIL/uL (ref 3.87–5.11)
RDW: 13.2 % (ref 11.5–15.5)
WBC: 7.1 10*3/uL (ref 4.0–10.5)
nRBC: 0 % (ref 0.0–0.2)

## 2023-05-11 LAB — BASIC METABOLIC PANEL WITH GFR
Anion gap: 10 (ref 5–15)
BUN: 9 mg/dL (ref 6–20)
CO2: 26 mmol/L (ref 22–32)
Calcium: 8.8 mg/dL — ABNORMAL LOW (ref 8.9–10.3)
Chloride: 109 mmol/L (ref 98–111)
Creatinine, Ser: 0.91 mg/dL (ref 0.44–1.00)
GFR, Estimated: >60 mL/min (ref >60)
Glucose, Bld: 85 mg/dL (ref 70–99)
Potassium: 3.4 mmol/L — ABNORMAL LOW (ref 3.5–5.1)
Sodium: 145 mmol/L (ref 135–145)

## 2023-05-11 LAB — MAGNESIUM: Magnesium: 1.8 mg/dL (ref 1.7–2.4)

## 2023-05-11 NOTE — Discharge Instructions (Signed)
 You were seen for your palpitations in the emergency department.   At home, please try to avoid caffeine and take your coreg .    Follow-up with your primary doctor in 2-3 days regarding your visit.  Schedule an appointment with cardiology as well.  Return immediately to the emergency department if you experience any of the following: Chest pain, shortness of breath, fainting, or any other concerning symptoms.    Thank you for visiting our Emergency Department. It was a pleasure taking care of you today.

## 2023-05-11 NOTE — ED Notes (Signed)
 Discharge instructions reviewed with patient. Patient questions answered and opportunity for education reviewed. Patient voices understanding of discharge instructions with no further questions. Patient ambulatory with steady gait to lobby.

## 2023-05-11 NOTE — ED Triage Notes (Signed)
 Pt arrives via EMS with reports of palpations and SOB that lasted about 30 min. Hx of SVT and EMS found pt to be in SVT, HR 190s. EMS gave 6 mg adenosine  and pt converted.  Pt did not take her coreg  until palpitations started.

## 2023-05-11 NOTE — ED Provider Notes (Signed)
 [place] EMERGENCY DEPARTMENT AT [facility] Provider Note   CSN: [id number] Arrival date & time: 05/11/23  1408     History  Chief Complaint  Patient presents with   SVT    Joyce Peters is a 53 y.o. female.  54 year old female with a history of SVT and hypertension on Coreg  who presents to the emergency department with palpitations.  Patient says that she had approximately 30 minutes of palpitations.  Called 911 and when they arrived she was in SVT and gave her 6 mg of adenosine  which spontaneously converted her back to normal sinus rhythm.  Reports that she did not take her Coreg  today until she started feeling the palpitations.  Did drink 2 cups of coffee which is atypical for her.  No alcohol use.  Denies any other symptoms at this time.  Feels back to baseline.        Home Medications Prior to Admission medications   Medication Sig Start Date End Date Taking? Authorizing Provider  acetaminophen  (TYLENOL ) 500 MG tablet Take 500 mg by mouth every 6 (six) hours as needed for mild pain.    [provider]  ASHWAGANDHA PO Take 1 capsule by mouth daily. Patient not taking: Reported on 04/18/2023    [provider]  carvedilol  (COREG ) 6.25 MG tablet Take 1 tablet (6.25 mg total) by mouth 2 (two) times daily with a meal. 04/18/23   Fountain, Madison L, NP  hydrOXYzine  (ATARAX ) 10 MG tablet Take 1 tablet (10 mg total) by mouth at bedtime as needed for anxiety. 03/26/23   Alexander-Savino, Washington, MD  nicotine  polacrilex (NICORETTE ) 2 MG gum Take 1 each (2 mg total) by mouth as needed for smoking cessation. Patient not taking: Reported on 04/18/2023 03/26/23   Alexander-Savino, Washington, MD  rosuvastatin  (CRESTOR ) 20 MG tablet Take 1 tablet (20 mg total) by mouth daily. 03/14/23   Maxie Spaniel, MD  simethicone  (MYLICON) 80 MG chewable tablet Chew 1 tablet (80 mg total) by mouth every 6 (six) hours as needed for flatulence. 03/28/23   Louvella Royalty, MD       Allergies    Shellfish allergy, Contrast media [iodinated contrast media], and Pork-derived products    Review of Systems   Review of Systems  Physical Exam Updated Vital Signs BP (!) 152/75   Pulse 84   Temp 98 F (36.7 C) (Oral)   Resp 18   Ht 5\' 7"  (1.702 m)   Wt 103 kg   SpO2 97%   BMI 35.56 kg/m  Physical Exam Vitals and nursing note reviewed.  Constitutional:      General: She is not in acute distress.    Appearance: She is well-developed.  HENT:     Head: Normocephalic and atraumatic.     Right Ear: External ear normal.     Left Ear: External ear normal.     Nose: Nose normal.  Eyes:     Extraocular Movements: Extraocular movements intact.     Conjunctiva/sclera: Conjunctivae normal.     Pupils: Pupils are equal, round, and reactive to light.  Cardiovascular:     Rate and Rhythm: Normal rate and regular rhythm.     Heart sounds: No murmur heard. Pulmonary:     Effort: Pulmonary effort is normal. No respiratory distress.     Breath sounds: Normal breath sounds.  Musculoskeletal:     Cervical back: Normal range of motion and neck supple.     Right lower leg: No edema.  Left lower leg: No edema.  Skin:    General: Skin is warm and dry.  Neurological:     Mental Status: She is alert and oriented to person, place, and time. Mental status is at baseline.  Psychiatric:        Mood and Affect: Mood normal.     ED Results / Procedures / Treatments   Labs (all labs ordered are listed, but only abnormal results are displayed) Labs Reviewed  CBC WITH DIFFERENTIAL/PLATELET - Abnormal; Notable for the following components:      Result Value   Hemoglobin 11.8 (*)    All other components within normal limits  BASIC METABOLIC PANEL WITH GFR - Abnormal; Notable for the following components:   Potassium 3.4 (*)    Calcium  8.8 (*)    All other components within normal limits  MAGNESIUM     EKG EKG Interpretation Date/Time:  Saturday May 11 2023 14:16:14  EDT Ventricular Rate:  89 PR Interval:  173 QRS Duration:  80 QT Interval:  363 QTC Calculation: 442 R Axis:   54  Text Interpretation: Sinus rhythm Consider left atrial enlargement Borderline T abnormalities, inferior leads Confirmed by Shyrl Doyne [phone]) on 05/11/2023 3:09:22 PM  EMS EKG:   Radiology No results found.  Procedures Procedures    Medications Ordered in ED Medications - No data to display  ED Course/ Medical Decision Making/ A&P                                 Medical Decision Making Amount and/or Complexity of Data Reviewed Labs: ordered. Radiology: ordered.   Joyce Peters is a 53 y.o. female with comorbidities that complicate the patient evaluation including  SVT and hypertension on Coreg  who presents to the emergency department with palpitations  Initial Ddx:  SVT, electrolyte abnormality, MI, PE, arrhythmia  MDM/Course:  Patient presents emergency department after an episode of SVT.  This was captured by EMS.  See the strip above.  Terminated with adenosine .  I suspect this is because she did not take her Coreg  this morning and also drink some caffeine.  Labs were obtained that did not show significant electrolyte abnormality that would be contributing.  EKG here did not show evidence of Brugada, long QT, or WPW.  Upon re-evaluation patient remained stable.  Called radiology about her x-ray which does not show any acute findings.  Will have her follow-up with her PCP and cardiology regarding her symptoms.  Counseled on caffeine cessation and to take her Coreg .  This patient presents to the ED for concern of complaints listed in HPI, this involves an extensive number of treatment options, and is a complaint that carries with it a high risk of complications and morbidity. Disposition including potential need for admission considered.   Dispo: DC Home. Return precautions discussed including, but not limited to, those listed in the AVS. Allowed pt  time to ask questions which were answered fully prior to dc.  Records reviewed Outpatient Clinic Notes The following labs were independently interpreted: Chemistry and show no acute abnormality I independently reviewed the following imaging with scope of interpretation limited to determining acute life threatening conditions related to emergency care: Chest x-ray and agree with the radiologist interpretation with the following exceptions: none I personally reviewed and interpreted cardiac monitoring: normal sinus rhythm  I personally reviewed and interpreted the pt's EKG: see above for interpretation  I have reviewed the patients home  medications and made adjustments as needed  Portions of this note were generated with Scientist, clinical (histocompatibility and immunogenetics). Dictation errors may occur despite best attempts at proofreading.     Final Clinical Impression(s) / ED Diagnoses Final diagnoses:  SVT (supraventricular tachycardia) (HCC)    Rx / DC Orders ED Discharge Orders     None         Ninetta Basket, MD 05/11/23 1616

## 2023-05-12 NOTE — Progress Notes (Unsigned)
  Electrophysiology Office Note:   Date:  05/14/2023  ID:  Joyce, Peters 10/12/1970, MRN [id number]  Primary Cardiologist: Randene Bustard, MD Primary Heart Failure: None Electrophysiologist: Halil Rentz Cortland Ding, MD      History of Present Illness:   Joyce Peters is a 53 y.o. female with h/o SVT, tobacco use, hypertension seen today for  for Electrophysiology evaluation of SVT at the request of [facility].    She presented to the emergency room 03/02/2023 with palpitations.  She received 6 mg of adenosine  which converted her to sinus rhythm.  She returned to the hospital 10 days later with recurrent palpitations.  Emergency room EKG showed SVT.  She again converted to sinus rhythm with adenosine  and was started on carvedilol .  Since starting carvedilol , she had side effects of stomach discomfort and fatigue.  Carvedilol  dose was decreased.  Today, denies symptoms of palpitations, chest pain, shortness of breath, orthopnea, PND, lower extremity edema, claudication, dizziness, presyncope, syncope, bleeding, or neurologic sequela. The patient is tolerating medications without difficulties.  She did present to the emergency room last weekend with episodes of SVT.  She was sitting down when the episode started.  She received adenosine  which terminated her SVT.   Review of systems complete and found to be negative unless listed in HPI.   EP Information / Studies Reviewed:    EKG is not ordered today. EKG from 03/12/23 reviewed which showed SVT        Risk Assessment/Calculations:             Physical Exam:   VS:  BP (!) 140/76 (BP Location: Right Arm, Patient Position: Sitting, Cuff Size: Large)   Pulse 64   Ht 5\' 7"  (1.702 m)   Wt 230 lb (104.3 kg)   SpO2 98%   BMI 36.02 kg/m    Wt Readings from Last 3 Encounters:  05/14/23 230 lb (104.3 kg)  05/11/23 227 lb 1.2 oz (103 kg)  04/18/23 227 lb 12.8 oz (103.3 kg)     GEN: Well nourished, well developed in no acute  distress NECK: No JVD; No carotid bruits CARDIAC: Regular rate and rhythm, no murmurs, rubs, gallops RESPIRATORY:  Clear to auscultation without rales, wheezing or rhonchi  ABDOMEN: Soft, non-tender, non-distended EXTREMITIES:  No edema; No deformity   ASSESSMENT AND PLAN:    1.  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  Currently on carvedilol .  Fatigue improved on reduced dose.  She is unfortunately continuing to have episodes of SVT.  We discussed ablation versus medication management.  At this point, she would like to avoid procedures if possible.  Ronell Boldin stop her carvedilol  and start diltiazem 180 mg daily.  I have told her that if she calls back and wishes for ablation, we can move forward with scheduling.  Additionally, we could increase her diltiazem.  2.  Hypertension: Mildly elevated.  Plan per primary physician  Follow up with EP APP in 3 months  Signed, Gerrit Rafalski Cortland Ding, MD

## 2023-05-14 ENCOUNTER — Encounter: Payer: Self-pay | Admitting: Cardiology

## 2023-05-14 ENCOUNTER — Ambulatory Visit: Attending: Cardiology | Admitting: Cardiology

## 2023-05-14 VITALS — BP 140/76 | HR 64 | Ht 67.0 in | Wt 230.0 lb

## 2023-05-14 DIAGNOSIS — I10 Essential (primary) hypertension: Secondary | ICD-10-CM | POA: Diagnosis not present

## 2023-05-14 DIAGNOSIS — I471 Supraventricular tachycardia, unspecified: Secondary | ICD-10-CM | POA: Insufficient documentation

## 2023-05-14 MED ORDER — DILTIAZEM HCL ER COATED BEADS 180 MG PO CP24: 180.0000 mg | ORAL_CAPSULE | Freq: Every day | ORAL | 3 refills | Status: DC

## 2023-05-14 NOTE — Patient Instructions (Signed)
 Medication Instructions:  Your physician has recommended you make the following change in your medication:  STOP Carvedilol  START Diltiazem 180 mg once daily   *If you need a refill on your cardiac medications before your next appointment, please call your pharmacy*   Lab Work: None ordered   Testing/Procedures: None ordered   Follow-Up: At [facility], you and your health needs are our priority.  As part of our continuing mission to provide you with exceptional heart care, we have created designated Provider Care Teams.  These Care Teams include your primary Cardiologist (physician) and Advanced Practice Providers (APPs -  Physician Assistants and Nurse Practitioners) who all work together to provide you with the care you need, when you need it.  We recommend signing up for the patient portal called "MyChart".  Sign up information is provided on this After Visit Summary.  MyChart is used to connect with patients for Virtual Visits (Telemedicine).  Patients are able to view lab/test results, encounter notes, upcoming appointments, etc.  Non-urgent messages can be sent to your provider as well.   To learn more about what you can do with MyChart, go to [email or web address].    Your next appointment:   3 month(s)  The format for your next appointment:   In Person  Provider:   You will see one of the following Advanced Practice Providers on your designated Care Team:   Mertha Abrahams, Kennard Pea [id number] [place], PA-C Suzann Riddle, NP Creighton Doffing, NP    Thank you for choosing [facility]!!   Reece Cane, RN [phone]  Other Instructions  Diltiazem Tablets What is this medication? DILTIAZEM (dil TYE a zem) treats high blood pressure and prevents chest pain (angina). It works by relaxing the blood vessels, which helps decrease the amount of work your heart has to do. It belongs to a group of medications called calcium  channel blockers. This medicine may be used  for other purposes; ask your health care provider or pharmacist if you have questions. COMMON BRAND NAME(S): Cardizem What should I tell my care team before I take this medication? They need to know if you have any of these conditions: Heart attack Heart disease Irregular heartbeat or rhythm Low blood pressure An unusual or allergic reaction to diltiazem, other medications, foods, dyes, or preservatives Pregnant or trying to get pregnant Breast-feeding How should I use this medication? Take this medication by mouth. Take it as directed on the prescription label at the same time every day. Keep taking it unless your care team tells you to stop. Talk to your care team about the use of this medication in children. Special care may be needed. Overdosage: If you think you have taken too much of this medicine contact a poison control center or emergency room at once. NOTE: This medicine is only for you. Do not share this medicine with others. What if I miss a dose? If you miss a dose, take it as soon as you can. If it is almost time for your next dose, take only that dose. Do not take double or extra doses. What may interact with this medication? Do not take this medication with any of the following: Cisapride [place]orn Pimozide Ranolazine Red yeast rice This medication may also interact with the following: Buspirone Carbamazepine Cimetidine Cyclosporine Digoxin Local anesthetics or general anesthetics Lovastatin Medications for anxiety or difficulty sleeping like midazolam and triazolam Medications for high blood pressure or heart problems Quinidine Rifampin, rifabutin, or rifapentine This list may not describe  all possible interactions. Give your health care provider a list of all the medicines, herbs, non-prescription drugs, or dietary supplements you use. Also tell them if you smoke, drink alcohol, or use illegal drugs. Some items may interact with your medicine. What should I  watch for while using this medication? Visit your care team for regular checks on your progress. Check your blood pressure as directed. Know what your blood pressure should be and when to contact your care team. Do not treat yourself for coughs, colds, or pain while you are using this medication without asking your care team for advice. Some medications may increase your blood pressure. This medication may cause serious skin reactions. They can happen weeks to months after starting the medication. Contact your care team right away if you notice fevers or flu-like symptoms with a rash. The rash may be red or purple and then turn into blisters or peeling of the skin. You may also notice a red rash with swelling of the face, lips, or lymph nodes in your neck or under your arms. This medication may affect your coordination, reaction time, or judgment. Do not drive or operate machinery until you know how this medication affects you. Sit up or stand slowly to reduce the risk of dizzy or fainting spells. Drinking alcohol with this medication can increase the risk of these side effects. What side effects may I notice from receiving this medication? Side effects that you should report to your care team as soon as possible: Allergic reactions--skin rash, itching, hives, swelling of the face, lips, tongue, or throat Heart failure--shortness of breath, swelling of the ankles, feet, or hands, sudden weight gain, unusual weakness or fatigue Slow heartbeat--dizziness, feeling faint or lightheaded, confusion, trouble breathing, unusual weakness or fatigue Liver injury--right upper belly pain, loss of appetite, nausea, light-colored stool, dark yellow or brown urine, yellowing skin or eyes, unusual weakness or fatigue Low blood pressure--dizziness, feeling faint or lightheaded, blurry vision Redness, blistering, peeling, or loosening of the skin, including inside the mouth Side effects that usually do not require  medical attention (report these to your care team if they continue or are bothersome): Constipation Facial flushing, redness Headache This list may not describe all possible side effects. Call your doctor for medical advice about side effects. You may report side effects to FDA at 1-800-FDA-1088. Where should I keep my medication? Keep out of the reach of children and pets. Store at room temperature between 15 and 30 degrees C (59 and 86 degrees F). Protect from moisture. Keep the container tightly closed. Throw away any unused medication after the expiration date. NOTE: This sheet is a summary. It may not cover all possible information. If you have questions about this medicine, talk to your doctor, pharmacist, or health care provider.  2024 Elsevier/Gold Standard (2022-12-14 00:00:00)

## 2023-06-13 ENCOUNTER — Ambulatory Visit: Payer: Self-pay | Admitting: Gastroenterology

## 2023-06-13 NOTE — Progress Notes (Deleted)
 Joyce Peters

## 2023-08-12 NOTE — Progress Notes (Deleted)
  Electrophysiology Office Note:   Date:  08/12/2023  ID:  Joyce Peters March 05, 1970, MRN [id number]  Primary Cardiologist: Alm Clay, MD Electrophysiologist: Will Gladis Norton, MD   Electrophysiologist:  Will Gladis Norton, MD  {Click to update primary MD,subspecialty MD or APP then REFRESH:1}    History of Present Illness:   Joyce Peters is a 53 y.o. female with h/o SVT, tobacco use, and HTN seen today for routine electrophysiology followup.   Since last being seen in our clinic the patient reports doing ***.  she denies chest pain, palpitations, dyspnea, PND, orthopnea, nausea, vomiting, dizziness, syncope, edema, weight gain, or early satiety.   Review of systems complete and found to be negative unless listed in HPI.   EP Information / Studies Reviewed:    {EKGtoday:28818}       Arrhythmia/Device History No specialty comments available.   Physical Exam:   VS:  There were no vitals taken for this visit.   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 Norton has offered ablation if she desires in the future  HTN Stable on current regimen   {Click here to Review PMH, Prob List, Meds, Allergies, SHx, FHx  :1}   Follow up with {EPMDS:28135::EP Team} {EPFOLLOW UP:28173}  Signed, Ozell Prentice Passey, PA-C

## 2023-08-13 ENCOUNTER — Ambulatory Visit: Payer: Self-pay | Admitting: Student

## 2023-08-13 DIAGNOSIS — I10 Essential (primary) hypertension: Secondary | ICD-10-CM

## 2023-08-13 DIAGNOSIS — I471 Supraventricular tachycardia, unspecified: Secondary | ICD-10-CM

## 2023-09-07 ENCOUNTER — Other Ambulatory Visit: Payer: Self-pay | Admitting: Cardiology

## 2023-09-09 ENCOUNTER — Telehealth: Payer: Self-pay | Admitting: Cardiology

## 2023-09-09 ENCOUNTER — Other Ambulatory Visit (HOSPITAL_COMMUNITY): Payer: Self-pay

## 2023-09-09 MED ORDER — DILTIAZEM HCL ER COATED BEADS 180 MG PO CP24: 180.0000 mg | ORAL_CAPSULE | Freq: Every day | ORAL | 2 refills | Status: DC

## 2023-09-09 NOTE — Telephone Encounter (Signed)
*  STAT* If patient is at the pharmacy, call can be transferred to refill team.   1. Which medications need to be refilled? (please list name of each medication and dose if known) diltiazem  (CARDIZEM  CD) 180 MG 24 hr capsule   4. Which pharmacy/location (including street and city if local pharmacy) is medication to be sent to?  [facility] - [place] ([place] - [address]     5. Do they need a 30 day or 90 day supply? 90   Scheduled 9/4

## 2023-09-09 NOTE — Telephone Encounter (Signed)
 Pt's medication was sent to pt's pharmacy as requested. Confirmation received.

## 2023-09-11 ENCOUNTER — Other Ambulatory Visit (HOSPITAL_COMMUNITY): Payer: Self-pay

## 2023-09-11 ENCOUNTER — Telehealth: Payer: Self-pay | Admitting: Pharmacy Technician

## 2023-09-11 NOTE — Telephone Encounter (Signed)
  Per test claim:   I called [facility] and they said they do not have active insurance on file for him either. They will call patient

## 2023-09-13 ENCOUNTER — Telehealth: Payer: Self-pay | Admitting: Cardiology

## 2023-09-13 MED ORDER — DILTIAZEM HCL ER COATED BEADS 180 MG PO CP24: 180.0000 mg | ORAL_CAPSULE | Freq: Every day | ORAL | 2 refills | Status: AC

## 2023-09-13 NOTE — Telephone Encounter (Signed)
 Pt's medication was sent to pt's pharmacy as requested. Confirmation received.

## 2023-09-13 NOTE — Telephone Encounter (Signed)
*  STAT* If patient is at the pharmacy, call can be transferred to refill team.   1. Which medications need to be refilled? (please list name of each medication and dose if known)   diltiazem  (CARDIZEM  CD) 180 MG 24 hr capsule   2. Would you like to learn more about the convenience, safety, & potential cost savings by using the [facility]?   3. Are you open to using the [facility] (Type [facility]. ).  4. Which pharmacy/location (including street and city if local pharmacy) is medication to be sent to?  [facility] - [place] ([place]), [place] - [address]   5. Do they need a 30 day or 90 day supply?   Patient stated her pharmacy has not received this refill and wants the prescription re-sent.

## 2023-09-17 ENCOUNTER — Encounter (HOSPITAL_BASED_OUTPATIENT_CLINIC_OR_DEPARTMENT_OTHER): Payer: Self-pay

## 2023-09-17 NOTE — Progress Notes (Deleted)
  Electrophysiology Office Note:   Date:  09/17/2023  ID:  Joyce, Peters 1970-12-23, MRN [id number]  Primary Cardiologist: Alm Clay, MD Electrophysiologist: Will Gladis Norton, MD   Electrophysiologist:  Will Gladis Norton, MD  {Click to update primary MD,subspecialty MD or APP then REFRESH:1}    History of Present Illness:   Joyce Peters is a 53 y.o. female with h/o SVT and HTN seen today for routine electrophysiology followup.   Since last being seen in our clinic the patient reports doing ***.  she denies chest pain, palpitations, dyspnea, PND, orthopnea, nausea, vomiting, dizziness, syncope, edema, weight gain, or early satiety.   Review of systems complete and found to be negative unless listed in HPI.   EP Information / Studies Reviewed:    {EKGtoday:28818}       Arrhythmia/Device History No specialty comments available.   Physical Exam:   VS:  There were no vitals taken for this visit.   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

## 2023-09-19 ENCOUNTER — Ambulatory Visit: Payer: Self-pay | Attending: Student | Admitting: Student

## 2023-09-19 DIAGNOSIS — I10 Essential (primary) hypertension: Secondary | ICD-10-CM

## 2023-09-19 DIAGNOSIS — I471 Supraventricular tachycardia, unspecified: Secondary | ICD-10-CM

## 2023-10-11 ENCOUNTER — Encounter: Payer: Self-pay | Admitting: *Deleted

## 2023-10-14 ENCOUNTER — Encounter: Payer: Self-pay | Admitting: *Deleted

## 2023-10-15 ENCOUNTER — Ambulatory Visit: Payer: Self-pay | Attending: Emergency Medicine | Admitting: Emergency Medicine

## 2023-10-15 NOTE — Progress Notes (Deleted)
 Cardiology Office Note:    Date:  10/15/2023  ID:  [name], [name] Sep 12, 1970, MRN [id number] PCP: Heddy Barren, DO  [facility] Providers Cardiologist:  Alm Clay, MD Electrophysiologist:  Will Gladis Norton, MD { Click to update primary MD,subspecialty MD or APP then REFRESH:1}    {Click to Open Review  :1}   Patient Profile:       Chief Complaint: *** History of Present Illness:  Joyce Peters is a 53 y.o. female with visit-pertinent history of SVT, active smoker, hypertension, family history of CAD, elevated A1C (6%), elevated LDL (160).    She went to [facility] ED March 02, 2023 with palpitations, relieved by 6 mg adenosine  by EMS.  ED EKG showed normal sinus rhythm with heart rate 90.  Patient was then asymptomatic.  Referred to cardiology.   Patient returned to the hospital February 25 through February 26.  She presented for palpitations x 3 weeks.  She had noted recurrent episodes of SVT for 3 weeks.  EKG in ED showing SVT.  She again converted to normal sinus rhythm with adenosine .  Started on carvedilol .  Troponin elevated thought likely secondary to demand ischemia.  Echo showed mild LVH, otherwise normal.  Coronary CTA ordered and completed and showed no evidence of CAD with CAC of 0.  Blood pressure in the 140s, discharged on Coreg  6.25 mg twice daily (chosen for both rate control and BP).  With elevated LDL of 160, started on Crestor  20 mg daily.  Tobacco cessation recommended as patient was smoking 4 to 5 cigarettes/day.  Patient was seen in clinic for follow-up on 04/18/2023.  She noted feeling extremely tired on carvedilol  and her PCP adjust dose to 3.125 mg in the morning and 6.25 mg in afternoon.  She had noted episodes of increased heart rate at home with the highest recorded rate being 155 bpm.  She did note improvement in the frequency intensity of SVT since starting carvedilol .  Given her ongoing episodes of SVT her carvedilol  was switched back  to 6.25 mg twice daily.  She also noted she never started Crestor .  She was referred to EP for further evaluation.  She followed up with Dr. Norton on 05/14/2023.  Her carvedilol  was discontinued and she was started on diltiazem  180 mg daily.  Ablation was discussed at that point patient would like to avoid procedures if possible.  Discussed the use of AI scribe software for clinical note transcription with the patient, who gave verbal consent to proceed.  History of Present Illness     Review of systems:  Please see the history of present illness. All other systems are reviewed and otherwise negative. ***      Studies Reviewed:        ***  Risk Assessment/Calculations:   {Does this patient have ATRIAL FIBRILLATION?:[phone]} No BP recorded.  {Refresh Note OR Click here to enter BP  :1}***        Physical Exam:   VS:  There were no vitals taken for this visit.   Wt Readings from Last 3 Encounters:  05/14/23 230 lb (104.3 kg)  05/11/23 227 lb 1.2 oz (103 kg)  04/18/23 227 lb 12.8 oz (103.3 kg)    GEN: Well nourished, well developed in no acute distress NECK: No JVD; No carotid bruits CARDIAC: ***RRR, no murmurs, rubs, gallops RESPIRATORY:  Clear to auscultation without rales, wheezing or rhonchi  ABDOMEN: Soft, non-tender, non-distended EXTREMITIES:  No edema; No acute deformity ***  Assessment and Plan:    Assessment and Plan Assessment & Plan      {Are you ordering a CV Procedure (e.g. stress test, cath, DCCV, TEE, etc)?   Press F2        :[id number]}  Dispo:  No follow-ups on file.  Signed, Lum LITTIE Louis, NP
